# Patient Record
Sex: Male | Born: 1945 | Race: White | Hispanic: No | Marital: Married | Smoking: Never smoker
Health system: Southern US, Community
[De-identification: ages and names within clinical notes are randomized; demographics above are authoritative.]

## PROBLEM LIST (undated history)

## (undated) DIAGNOSIS — N419 Inflammatory disease of prostate, unspecified: Secondary | ICD-10-CM

## (undated) DIAGNOSIS — N210 Calculus in bladder: Secondary | ICD-10-CM

## (undated) DIAGNOSIS — K219 Gastro-esophageal reflux disease without esophagitis: Secondary | ICD-10-CM

## (undated) DIAGNOSIS — E291 Testicular hypofunction: Secondary | ICD-10-CM

## (undated) DIAGNOSIS — R972 Elevated prostate specific antigen [PSA]: Secondary | ICD-10-CM

## (undated) DIAGNOSIS — R3911 Hesitancy of micturition: Secondary | ICD-10-CM

## (undated) DIAGNOSIS — N200 Calculus of kidney: Secondary | ICD-10-CM

## (undated) DIAGNOSIS — Z87442 Personal history of urinary calculi: Secondary | ICD-10-CM

## (undated) DIAGNOSIS — N400 Enlarged prostate without lower urinary tract symptoms: Secondary | ICD-10-CM

## (undated) HISTORY — PX: EXTRACORPOREAL SHOCK WAVE LITHOTRIPSY: SHX1557

## (undated) HISTORY — DX: Testicular hypofunction: E29.1

## (undated) HISTORY — DX: Inflammatory disease of prostate, unspecified: N41.9

## (undated) HISTORY — DX: Calculus in bladder: N21.0

## (undated) HISTORY — DX: Hesitancy of micturition: R39.11

## (undated) HISTORY — DX: Benign prostatic hyperplasia without lower urinary tract symptoms: N40.0

## (undated) HISTORY — DX: Calculus of kidney: N20.0

## (undated) HISTORY — PX: WISDOM TOOTH EXTRACTION: SHX21

## (undated) HISTORY — PX: OTHER SURGICAL HISTORY: SHX169

---

## 2007-08-01 ENCOUNTER — Ambulatory Visit: Payer: Self-pay | Admitting: Internal Medicine

## 2007-09-03 ENCOUNTER — Ambulatory Visit: Payer: Self-pay | Admitting: Urology

## 2007-09-23 ENCOUNTER — Ambulatory Visit: Payer: Self-pay | Admitting: Urology

## 2007-11-09 ENCOUNTER — Ambulatory Visit: Payer: Self-pay | Admitting: Urology

## 2007-12-28 ENCOUNTER — Ambulatory Visit: Payer: Self-pay | Admitting: General Practice

## 2008-06-13 ENCOUNTER — Ambulatory Visit: Payer: Self-pay | Admitting: Urology

## 2008-06-16 ENCOUNTER — Ambulatory Visit: Payer: Self-pay | Admitting: Urology

## 2011-03-04 ENCOUNTER — Ambulatory Visit: Payer: Self-pay | Admitting: Urology

## 2011-03-14 ENCOUNTER — Ambulatory Visit: Payer: Self-pay | Admitting: Urology

## 2014-01-18 ENCOUNTER — Ambulatory Visit: Payer: Self-pay

## 2014-01-19 ENCOUNTER — Ambulatory Visit: Payer: Self-pay | Admitting: Urology

## 2014-01-21 ENCOUNTER — Ambulatory Visit: Payer: Self-pay | Admitting: Urology

## 2014-02-02 ENCOUNTER — Ambulatory Visit: Payer: Self-pay

## 2014-07-23 NOTE — Op Note (Signed)
PATIENT NAME:  Maxwell Jones, Maxwell Jones MR#:  [id number] DATE OF BIRTH:  February 03, 1946  DATE OF PROCEDURE:  01/21/2014  PREOPERATIVE DIAGNOSIS: Mid left ureteral calculus.   POSTOPERATIVE DIAGNOSIS: Mid left ureteral calculus.   PROCEDURE: Cystoscopy, flexible ureteroscopy, laser lithotripsy through the flexible scope, and stent.   DESCRIPTION OF PROCEDURE: With the patient sterilely prepped and draped in supine lithotomy position for ease of approach to the external genitalia, and after an appropriate timeout, I had cystoscoped the patient. I placed a sensor wire up the left ureter into the kidney. Repeated with a Glidewire. Attempted to go over the Glidewire with a flexible ureteroscope and I could not access the orifice. So then I used a Navigator to access the orifice. Placed the scope up through the Navigator and see a calculus in the mid ureter that is quite large. This appears soft, breaks up quite easily with a 365 micron holmium laser. Fragments quickly go up into the kidney as I break it up because this stone has been there for a long time. I chase the largest fragment up into a calyx and destroy it. The patient tolerated the procedure well. At this point because the pieces are so small, I am not going to be able to grasp them in the kidney so I put a 26 cm 6-French double-J stent up over the sensor wire. The Glidewire has been removed. The Glidewire was utilized to place the Navigator up into the ureter. The patient is sent to recovery. The bladder is emptied. The position of the stent is checked visually. It is in good position in the bladder and curled in the kidney. The patient is sent to recovery in satisfactory condition after placing a B and O suppository in the rectum and 30 mL of Marcaine in the bladder. Bladder is emptied. The patient should have no need for a Foley, as he does not have a large prostate. He has minimal trabeculation in the bladder. He has some minor middle lobe hypertrophy only. So the  rectal exam revealed no nodules, masses or growths in the rectum, either. The patient should do well postoperatively with a stent.   ____________________________ Janice Coffin. Elnoria Howard, DO rdh:ST D: 01/21/2014 13:36:43 ET T: 01/21/2014 23:48:36 ET JOB#: [id number]  cc: Janice Coffin. Elnoria Howard, DO, <Dictator> RICHARD D HART DO ELECTRONICALLY SIGNED 02/14/2014 16:33

## 2014-10-28 ENCOUNTER — Other Ambulatory Visit: Payer: Self-pay

## 2014-10-28 DIAGNOSIS — N200 Calculus of kidney: Secondary | ICD-10-CM

## 2014-11-29 ENCOUNTER — Encounter: Payer: Self-pay | Admitting: Urology

## 2014-12-07 ENCOUNTER — Ambulatory Visit: Payer: Self-pay | Admitting: Urology

## 2014-12-15 ENCOUNTER — Encounter: Payer: Self-pay | Admitting: Urology

## 2014-12-15 ENCOUNTER — Ambulatory Visit
Admission: RE | Admit: 2014-12-15 | Discharge: 2014-12-15 | Disposition: A | Discharge status: Home / Self Care | Payer: PPO | Source: Ambulatory Visit | Attending: Urology | Admitting: Urology

## 2014-12-15 ENCOUNTER — Ambulatory Visit (INDEPENDENT_AMBULATORY_CARE_PROVIDER_SITE_OTHER): Payer: PPO | Admitting: Urology

## 2014-12-15 VITALS — BP 142/81 | HR 85 | Ht 70.0 in | Wt 189.1 lb

## 2014-12-15 DIAGNOSIS — N200 Calculus of kidney: Secondary | ICD-10-CM | POA: Insufficient documentation

## 2014-12-15 DIAGNOSIS — Z87442 Personal history of urinary calculi: Secondary | ICD-10-CM | POA: Diagnosis not present

## 2014-12-15 DIAGNOSIS — N400 Enlarged prostate without lower urinary tract symptoms: Secondary | ICD-10-CM

## 2014-12-15 NOTE — Progress Notes (Signed)
12/15/2014 3:39 PM   [name] [birth year] [id number]  Referring provider: No referring provider defined for this encounter.  Chief Complaint  Patient presents with  . Nephrolithiasis    HPI: No voiding symptoms. History of recently passed calculus. One small calculus in the left kidney by KUB. Patient has no symptoms urgency frequency dysuria Cetera.   PMH: Past Medical History  Diagnosis Date  . Bladder calculi   . Kidney stone on left side   . Prostatitis   . BPH (benign prostatic hyperplasia)   . Hypogonadism in male   . Urinary hesitancy     Surgical History: Past Surgical History  Procedure Laterality Date  . Cystolithalopaxy w/holmium laser    . Extracorporeal shock wave lithotripsy      Home Medications:    Medication List       This list is accurate as of: 12/15/14  3:39 PM.  Always use your most recent med list.               ibuprofen 200 MG tablet  Commonly known as:  ADVIL,MOTRIN  Take by mouth.        Allergies:  Allergies  Allergen Reactions  . Iodinated Diagnostic Agents Rash  . Minocycline Rash    Family History: Family History  Problem Relation Age of Onset  . Prostate cancer Neg Hx   . Nephrolithiasis Mother   . Kidney cancer Neg Hx     Social History:  reports that he has quit smoking. He does not have any smokeless tobacco history on file. He reports that he drinks alcohol. He reports that he does not use illicit drugs.  ROS: UROLOGY Frequent Urination?: No Hard to postpone urination?: No Burning/pain with urination?: No Get up at night to urinate?: No Leakage of urine?: No Urine stream starts and stops?: No Trouble starting stream?: No Do you have to strain to urinate?: No Blood in urine?: No Urinary tract infection?: No Sexually transmitted disease?: No Injury to kidneys or bladder?: No Painful intercourse?: No Weak stream?: No Erection problems?: No Penile pain?: No  Gastrointestinal Nausea?:  No Vomiting?: No Indigestion/heartburn?: No Diarrhea?: No Constipation?: No  Constitutional Fever: No Night sweats?: No Weight loss?: No Fatigue?: No  Skin Skin rash/lesions?: No Itching?: No  Eyes Blurred vision?: No Double vision?: No  Ears/Nose/Throat Sore throat?: No Sinus problems?: No  Hematologic/Lymphatic Swollen glands?: No Easy bruising?: No  Cardiovascular Leg swelling?: No Chest pain?: No  Respiratory Cough?: No Shortness of breath?: No  Endocrine Excessive thirst?: No  Musculoskeletal Back pain?: No Joint pain?: No  Neurological Headaches?: No Dizziness?: No  Psychologic Depression?: No Anxiety?: No  Physical Exam: BP 142/81 mmHg  Pulse 85  Ht 5\' 10"  (1.778 m)  Wt 189 lb 1.6 oz (85.775 kg)  BMI 27.13 kg/m2  Constitutional:  Alert and oriented, No acute distress. HEENT: [place] AT, moist mucus membranes.  Trachea midline, no masses. Cardiovascular: No clubbing, cyanosis, or edema. Respiratory: Normal respiratory effort, no increased work of breathing. GI: Abdomen is soft, nontender, nondistended, no abdominal masses GU: No CVA tenderness. Small prostate, smooth firm nonnodular rectal sphincter normal no rectal masses penis uncircumcised no tumors masses or growths beneath foreskin testes descended and normal. Skin: No rashes, bruises or suspicious lesions. Lymph: No cervical or inguinal adenopathy. Neurologic: Grossly intact, no focal deficits, moving all 4 extremities. Psychiatric: Normal mood and affect.  Laboratory Data: No results found for: WBC, HGB, HCT, MCV, PLT  No results found for:  CREATININE  No results found for: PSA  No results found for: TESTOSTERONE  No results found for: HGBA1C  Urinalysis No results found for: COLORURINE, APPEARANCEUR, LABSPEC, PHURINE, GLUCOSEU, HGBUR, BILIRUBINUR, KETONESUR, PROTEINUR, UROBILINOGEN, NITRITE, LEUKOCYTESUR  Pertinent Imaging: KUB reveals small upper pole stone left kidney  Hx  recent passed calulousfollow-up yearly for KUB and PSA with routine BPH recheck  Assessment & Plan: history of calculi and BPH   1. BPH (benign prostatic hyperplasia) PSA drawn   No Follow-up on file.  Collier Flowers, [facility] [address] [place], [place] [address] [phone]

## 2014-12-16 LAB — PSA: Prostate Specific Ag, Serum: 5.4 ng/mL — ABNORMAL HIGH (ref 0.0–4.0)

## 2014-12-20 ENCOUNTER — Telehealth: Payer: Self-pay

## 2014-12-20 DIAGNOSIS — R972 Elevated prostate specific antigen [PSA]: Secondary | ICD-10-CM

## 2014-12-20 NOTE — Telephone Encounter (Signed)
-----   Message from Nori Riis, PA-C sent at 12/20/2014  8:22 AM EDT ----- Patient's PSA is elevated.  He should have it repeated to rule out lab error.

## 2014-12-20 NOTE — Telephone Encounter (Signed)
Spoke with pt in reference to PSA results. Pt will RTC tomorrow for psa lab draw.

## 2014-12-21 ENCOUNTER — Other Ambulatory Visit: Payer: PPO

## 2014-12-21 DIAGNOSIS — R972 Elevated prostate specific antigen [PSA]: Secondary | ICD-10-CM

## 2014-12-22 ENCOUNTER — Telehealth: Payer: Self-pay

## 2014-12-22 LAB — PSA: PROSTATE SPECIFIC AG, SERUM: 4.5 ng/mL — AB (ref 0.0–4.0)

## 2014-12-22 NOTE — Telephone Encounter (Signed)
-----   Message from Nori Riis, PA-C sent at 12/22/2014  8:57 AM EDT ----- Patient's PSA did not return to his baseline.  His last PSA with [place] on 08/12/2013 was 0.8 ng/mL.  It is now 4.5 ng/mL.  I would recommend a biopsy.  This is a significant increase.

## 2014-12-22 NOTE — Telephone Encounter (Signed)
Pt returned call. Made pt aware of PSA results. Pt requested prostate bx instructions be sent to him. Instructions sent via mail. Pt stated he would read up, think about it, and call back with any questions. Nurse reassured pt that would be ok but reinforced the need for the call back. Pt voiced understanding.

## 2015-01-05 ENCOUNTER — Ambulatory Visit (INDEPENDENT_AMBULATORY_CARE_PROVIDER_SITE_OTHER): Payer: PPO | Admitting: Urology

## 2015-01-06 ENCOUNTER — Ambulatory Visit: Payer: PPO | Admitting: Urology

## 2015-01-19 ENCOUNTER — Other Ambulatory Visit: Payer: Self-pay | Admitting: Urology

## 2015-01-19 ENCOUNTER — Encounter: Payer: Self-pay | Admitting: Urology

## 2015-01-19 ENCOUNTER — Ambulatory Visit: Payer: PPO | Admitting: Urology

## 2015-01-19 VITALS — BP 153/88 | HR 78 | Ht 70.0 in | Wt 190.3 lb

## 2015-01-19 DIAGNOSIS — R972 Elevated prostate specific antigen [PSA]: Secondary | ICD-10-CM

## 2015-01-19 MED ORDER — GENTAMICIN SULFATE 40 MG/ML IJ SOLN
80.0000 mg | Freq: Once | INTRAMUSCULAR | Status: AC
  Administered 2015-01-19: 80 mg via INTRAMUSCULAR

## 2015-01-19 MED ORDER — LEVOFLOXACIN 500 MG PO TABS
500.0000 mg | ORAL_TABLET | Freq: Once | ORAL | Status: AC
Start: 2015-01-19 — End: 2015-01-19
  Administered 2015-01-19: 500 mg via ORAL

## 2015-01-19 MED ORDER — LEVOFLOXACIN 500 MG PO TABS: 500.0000 mg | ORAL_TABLET | Freq: Every day | ORAL | Status: DC

## 2015-01-19 MED ORDER — LIDOCAINE HCL 2 % EX GEL
1.0000 "application " | Freq: Once | CUTANEOUS | Status: AC
  Administered 2015-01-19: 1 via URETHRAL

## 2015-01-19 NOTE — Progress Notes (Unsigned)
Prostate Biopsy Procedure   Informed consent was obtained after discussing risks/benefits of the procedure.  A time out was performed to ensure correct patient identity.  Pre-Procedure: - Last PSA Level:  5.4  - Gentamicin given prophylactically - Levaquin 500 mg administered PO -Transrectal Ultrasound performed revealing a  30 gm prostate -No significant hypoechoic or median lobe noted  Procedure: - Prostate block performed using 10 cc 1% lidocaine and biopsies taken from sextant areas, a total of 12 under ultrasound guidance.  Post-Procedure: - Patient tolerated the procedure well - He was counseled to seek immediate medical attention if experiences any severe pain, significant bleeding, or fevers - Return in one week to discuss biopsy results

## 2015-01-23 ENCOUNTER — Ambulatory Visit: Payer: PPO | Attending: Orthopedic Surgery | Admitting: Physical Therapy

## 2015-01-23 DIAGNOSIS — R293 Abnormal posture: Secondary | ICD-10-CM

## 2015-01-23 DIAGNOSIS — M5382 Other specified dorsopathies, cervical region: Secondary | ICD-10-CM | POA: Diagnosis present

## 2015-01-23 NOTE — Therapy (Signed)
[facility] [address] [place], [address] Phone: [phone]   Fax:  [phone]  Physical Therapy Evaluation  Patient Details  Name: KASHEEM TONER MRN: [id number] Date of Birth: [birth year] No Data Recorded  Encounter Date: 01/23/2015      PT End of Session - 01/23/15 1215    Visit Number 1   Number of Visits 1   Date for PT Re-Evaluation 02/20/15   PT Start Time 1100   PT Stop Time 1130   PT Time Calculation (min) 30 min   Activity Tolerance Patient tolerated treatment well;No increased pain   Behavior During Therapy [facility] for tasks assessed/performed      Past Medical History  Diagnosis Date  . Bladder calculi   . Kidney stone on left side   . Prostatitis   . BPH (benign prostatic hyperplasia)   . Hypogonadism in male   . Urinary hesitancy     Past Surgical History  Procedure Laterality Date  . Cystolithalopaxy w/holmium laser    . Extracorporeal shock wave lithotripsy      There were no vitals filed for this visit.  Visit Diagnosis:  Posture abnormality - Plan: PT plan of care cert/re-cert  Weakness of neck - Plan: PT plan of care cert/re-cert      Subjective Assessment - 01/23/15 1205    Subjective Patient reports that in June he had an episode of right arm heaviness, weakness and pain. He reports a second episode of this in the last few weeks. Reports that currently he is not having any symptoms or pain.    Patient Stated Goals "To not have to come back and not have this pain happen again."    Currently in Pain? 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  No   Is the patient reluctant to leave their home because of a fear of falling?  No   Home Ecologist residence   Prior Function   Level of Independence Independent   Cognition   Overall Cognitive Status Within Functional Limits for tasks assessed   Posture/Postural Control   Posture Comments rounded shoulders   ROM / Strength   AROM / PROM / Strength AROM;Strength   AROM   Overall AROM  Within functional limits for tasks performed   Overall AROM Comments cervical rotation is 45 degrees right, 50 degrees to left.cervical extension 70 degrees and 50 degrees of cervical flexion.    Strength   Overall Strength Within functional limits for tasks performed       Treatment this session to include:  Education and demonstration for cervical exercise and postural exercise as well as cervical stretching 1. Chin tuck 2. Prone cobra 3. Back postural exercise against wall.  4. Levator scap stretch Handout with instruction for proper technique, reps and frequency given to patient.        PT Education - 01/23/15 1214    Education provided Yes   Education Details neck and postural exercises to  include: chin tuck, prone cobra, and back postural exercise.   Person(s) Educated Patient   Methods Explanation;Demonstration;Handout;Verbal cues   Comprehension Verbalized understanding;Returned demonstration;Verbal cues required             PT Long Term Goals - 02-09-15 1219    PT LONG TERM GOAL #1   Title Patient will continue to report 0/10 pain in right arm.    Baseline 0/10   Time 4   Period Weeks   Status New   PT LONG TERM GOAL #2   Title Patient will be independent with HEP for improved strength, stretching and posture.    Time 4   Period Weeks   Status New               Plan - 02-09-15 1216    Clinical Impression Statement Patient is a 69 year old male with reported episodes of RIght arm heaviness, weakness, tingling and  pain. At time of evaluation patient does not have any pain or symptoms. Range of motion of neck and shoulders is WNL. Strenth is normal. Patient is shown and return demonstrates 3 exercises to practice on his own. Patient will call if any return of his symptoms occurs.    Pt will benefit from skilled therapeutic intervention in order to improve on the following deficits Postural dysfunction   Rehab Potential Good   PT Frequency Biweekly   PT Duration 4 weeks   PT Treatment/Interventions Therapeutic exercise;Patient/family education;Manual techniques   PT Home Exercise Plan chin tucks, prone cobra, pstural back/neck exercise. handout given.    Consulted and Agree with Plan of Care Patient          G-Codes - 02-09-2015 1222    Functional Limitation Carrying, moving and handling objects   Carrying, Moving and Handling Objects Current Status [id number] 0 percent impaired, limited or restricted   Carrying, Moving and Handling Objects Goal Status ([id number]) 0 percent impaired, limited or restricted   Carrying, Moving and Handling Objects Discharge Status [phone]) 0 percent impaired, limited or restricted       Problem List There are no active problems to display for this patient.   Colbin Jovel, PT, MPT, GCS February 09, 2015, 12:26 PM  [facility] [address] [place], [address] Phone: [phone]   Fax:  [phone]  Name: MELBURN TREIBER MRN: [id number] Date of Birth: 03/18/1946

## 2015-01-26 ENCOUNTER — Encounter: Payer: PPO | Admitting: Physical Therapy

## 2015-01-26 LAB — PATHOLOGY REPORT

## 2015-01-27 ENCOUNTER — Ambulatory Visit: Payer: PPO

## 2015-01-30 ENCOUNTER — Encounter: Payer: PPO | Admitting: Physical Therapy

## 2015-01-31 ENCOUNTER — Other Ambulatory Visit: Payer: Self-pay | Admitting: Urology

## 2015-01-31 ENCOUNTER — Ambulatory Visit: Payer: PPO | Admitting: Urology

## 2015-02-02 ENCOUNTER — Encounter: Payer: PPO | Admitting: Physical Therapy

## 2015-02-06 ENCOUNTER — Encounter: Payer: PPO | Admitting: Physical Therapy

## 2015-07-05 ENCOUNTER — Encounter: Payer: Self-pay | Admitting: Urology

## 2015-07-05 ENCOUNTER — Ambulatory Visit (INDEPENDENT_AMBULATORY_CARE_PROVIDER_SITE_OTHER): Payer: PPO | Admitting: Urology

## 2015-07-05 VITALS — BP 147/75 | HR 76 | Ht 70.0 in | Wt 196.3 lb

## 2015-07-05 DIAGNOSIS — N401 Enlarged prostate with lower urinary tract symptoms: Secondary | ICD-10-CM

## 2015-07-05 DIAGNOSIS — E291 Testicular hypofunction: Secondary | ICD-10-CM

## 2015-07-05 DIAGNOSIS — N138 Other obstructive and reflux uropathy: Secondary | ICD-10-CM | POA: Insufficient documentation

## 2015-07-05 DIAGNOSIS — N400 Enlarged prostate without lower urinary tract symptoms: Secondary | ICD-10-CM | POA: Diagnosis not present

## 2015-07-05 DIAGNOSIS — R972 Elevated prostate specific antigen [PSA]: Secondary | ICD-10-CM | POA: Diagnosis not present

## 2015-07-05 DIAGNOSIS — N200 Calculus of kidney: Secondary | ICD-10-CM

## 2015-07-05 LAB — URINALYSIS, COMPLETE
Bilirubin, UA: NEGATIVE
GLUCOSE, UA: NEGATIVE
KETONES UA: NEGATIVE
Leukocytes, UA: NEGATIVE
NITRITE UA: NEGATIVE
Protein, UA: NEGATIVE
SPEC GRAV UA: 1.025 (ref 1.005–1.030)
UUROB: 0.2 mg/dL (ref 0.2–1.0)
pH, UA: 6 (ref 5.0–7.5)

## 2015-07-05 LAB — MICROSCOPIC EXAMINATION
Bacteria, UA: NONE SEEN
Epithelial Cells (non renal): NONE SEEN /hpf (ref 0–10)

## 2015-07-05 NOTE — Progress Notes (Signed)
07/05/2015 9:04 AM   Maxwell Jones [birth year] [id number]  Referring provider: Madelyn Brunner, MD [facility] [place], [place] [address]  Chief Complaint  Patient presents with  . Benign Prostatic Hypertrophy    6 month follow up    HPI: Patient is a 70 year old Caucasian male who presents today for a 6 month follow up for nephrolithiasis, elevated PSA and BPH with LUTS.  Nephrolithiasis Patient has a stable 3 mm left renal stone.  Not experiencing any gross hematuria or flank pain.  He does not desire intervention at this time for the stone.  Elevated PSA Patient underwent a prostate biopsy on 01/19/2015 for an elevated PSA of 5.4.  This was a significant increase from his previous PSA of 0.8 in 2015.   Biopsy results were benign.  He does not have a family history of prostate cancer.  BPH WITH LUTS His IPSS score today is 1, which is mild lower urinary tract symptomatology. He is delighted with his quality life due to his urinary symptoms.  He denies any dysuria, hematuria or suprapubic pain.  He also denies any recent fevers, chills, nausea or vomiting.      IPSS      07/05/15 0800       International Prostate Symptom Score   How often have you had the sensation of not emptying your bladder? Not at All     How often have you had to urinate less than every two hours? Not at All     How often have you found you stopped and started again several times when you urinated? Not at All     How often have you found it difficult to postpone urination? Not at All     How often have you had a weak urinary stream? Not at All     How often have you had to strain to start urination? Not at All     How many times did you typically get up at night to urinate? 1 Time     Total IPSS Score 1     Quality of Life due to urinary symptoms   If you were to spend the rest of your life with your urinary condition just the way it is now how would you feel about that?  Delighted        Score:  1-7 Mild 8-19 Moderate 20-35 Severe     PMH: Past Medical History  Diagnosis Date  . Bladder calculi   . Kidney stone on left side   . Prostatitis   . BPH (benign prostatic hyperplasia)   . Hypogonadism in male   . Urinary hesitancy     Surgical History: Past Surgical History  Procedure Laterality Date  . Cystolithalopaxy w/holmium laser    . Extracorporeal shock wave lithotripsy      Home Medications:    Medication List       This list is accurate as of: 07/05/15  9:04 AM.  Always use your most recent med list.               ibuprofen 200 MG tablet  Commonly known as:  ADVIL,MOTRIN  Take by mouth. Reported on 07/05/2015        Allergies:  Allergies  Allergen Reactions  . Iodinated Diagnostic Agents Rash  . Minocycline Rash    Family History: Family History  Problem Relation Age of Onset  . Prostate cancer Neg Hx   .  Nephrolithiasis Mother   . Kidney cancer Neg Hx     Social History:  reports that he has never smoked. He does not have any smokeless tobacco history on file. He reports that he drinks alcohol. He reports that he does not use illicit drugs.  ROS: UROLOGY Frequent Urination?: No Hard to postpone urination?: No Burning/pain with urination?: No Get up at night to urinate?: No Leakage of urine?: No Urine stream starts and stops?: No Trouble starting stream?: No Do you have to strain to urinate?: No Blood in urine?: No Urinary tract infection?: No Sexually transmitted disease?: No Injury to kidneys or bladder?: No Painful intercourse?: No Weak stream?: No Erection problems?: No Penile pain?: No  Gastrointestinal Nausea?: No Vomiting?: No Indigestion/heartburn?: No Diarrhea?: No Constipation?: No  Constitutional Fever: No Night sweats?: No Weight loss?: No Fatigue?: No  Skin Skin rash/lesions?: No Itching?: No  Eyes Blurred vision?: No Double vision?: No  Ears/Nose/Throat Sore  throat?: No Sinus problems?: No  Hematologic/Lymphatic Swollen glands?: No Easy bruising?: No  Cardiovascular Leg swelling?: No Chest pain?: No  Respiratory Cough?: No Shortness of breath?: No  Endocrine Excessive thirst?: No  Musculoskeletal Back pain?: No Joint pain?: No  Neurological Headaches?: No Dizziness?: No  Psychologic Depression?: No Anxiety?: No  Physical Exam: BP 147/75 mmHg  Pulse 76  Ht 5\' 10"  (1.778 m)  Wt 196 lb 4.8 oz (89.041 kg)  BMI 28.17 kg/m2  Constitutional: Well nourished. Alert and oriented, No acute distress. HEENT: [place] AT, moist mucus membranes. Trachea midline, no masses. Cardiovascular: No clubbing, cyanosis, or edema. Respiratory: Normal respiratory effort, no increased work of breathing. GI: Abdomen is soft, non tender, non distended, no abdominal masses. Liver and spleen not palpable.  No hernias appreciated.  Stool sample for occult testing is not indicated.   GU: No CVA tenderness.  No bladder fullness or masses.  Patient with uncircumcised phallus. Foreskin easily retracted  Urethral meatus is patent.  No penile discharge. No penile lesions or rashes. Scrotum without lesions, cysts, rashes and/or edema.  Testicles are located scrotally bilaterally. No masses are appreciated in the testicles. Left and right epididymis are normal. Rectal: Patient with  normal sphincter tone. Anus and perineum without scarring or rashes. No rectal masses are appreciated. Prostate is approximately 55 grams, no nodules are appreciated. Seminal vesicles are normal. Skin: No rashes, bruises or suspicious lesions. Lymph: No cervical or inguinal adenopathy. Neurologic: Grossly intact, no focal deficits, moving all 4 extremities. Psychiatric: Normal mood and affect.  Laboratory Data: PSA History  5.4 ng/mL on 12/15/2014  4.5 ng/mL on 12/21/2014  TRUSPBx of prostate on 01/26/2015 was negative    Urinalysis Results for orders placed or performed in visit  on 07/05/15  Microscopic Examination  Result Value Ref Range   WBC, UA 0-5 0 -  5 /hpf   RBC, UA 0-2 0 -  2 /hpf   Epithelial Cells (non renal) None seen 0 - 10 /hpf   Bacteria, UA None seen None seen/Few  Urinalysis, Complete  Result Value Ref Range   Specific Gravity, UA 1.025 1.005 - 1.030   pH, UA 6.0 5.0 - 7.5   Color, UA Yellow Yellow   Appearance Ur Clear Clear   Leukocytes, UA Negative Negative   Protein, UA Negative Negative/Trace   Glucose, UA Negative Negative   Ketones, UA Negative Negative   RBC, UA Trace (A) Negative   Bilirubin, UA Negative Negative   Urobilinogen, Ur 0.2 0.2 - 1.0 mg/dL  Nitrite, UA Negative Negative   Microscopic Examination See below:     Pertinent Imaging: CLINICAL DATA: History of kidney stones no pain and hematuria today, 8 month followup  EXAM: ABDOMEN - 1 VIEW  COMPARISON: None.  FINDINGS: 3 mm stone over the left kidney lower pole. Bilateral pelvic phleboliths.  IMPRESSION: 3 mm left renal stone   Electronically Signed  By: Skipper Cliche M.D.  On: 12/15/2014 16:33  Assessment & Plan:    1. Elevated PSA:    iPSA 5.4 in 12/2014.  Biopsy benign.  Continue with PSA and exam biannually.  MRI if PSA returns elevated.    2. BPH (benign prostatic hyperplasia) with LUTS:   IPSS score is 1/0.  We will continue to monitor.  He will have his IPSS score, exam and PSA in 12 months.  - Urinalysis, Complete - PSA  3. Hypogonadism:   Experiencing hot flashes.  Check testosterone level today. (blood drawn before AM).  If low, not interested in low- T therapy but will get a DEXA scan.    4. Kidney stone:    Stable 3 mm left renal stone.  No intervention requested at this time.  Continue with KUB annually.     Return in about 6 months (around 01/04/2016) for IPSS score and exam.  These notes generated with voice recognition software. I apologize for typographical errors.  Zara Council, [facility] [address], [place], [place] [address] [phone]

## 2015-07-06 ENCOUNTER — Telehealth: Payer: Self-pay

## 2015-07-06 DIAGNOSIS — E291 Testicular hypofunction: Secondary | ICD-10-CM

## 2015-07-06 LAB — TESTOSTERONE: TESTOSTERONE: 201 ng/dL — AB (ref 348–1197)

## 2015-07-06 LAB — PSA: PROSTATE SPECIFIC AG, SERUM: 3.5 ng/mL (ref 0.0–4.0)

## 2015-07-06 NOTE — Telephone Encounter (Signed)
Spoke with pt in reference to testosterone labs and DEXA scan. Pt voiced understanding. Scan was ordered.

## 2015-07-06 NOTE — Telephone Encounter (Signed)
-----   Message from Nori Riis, PA-C sent at 07/06/2015  8:30 AM EDT ----- Patient's testosterone is low.  I would like to get a DEXA scan to check his bone density.

## 2015-07-06 NOTE — Telephone Encounter (Signed)
LMOM

## 2015-07-12 ENCOUNTER — Ambulatory Visit: Payer: PPO | Admitting: Urology

## 2015-12-11 DIAGNOSIS — D2372 Other benign neoplasm of skin of left lower limb, including hip: Secondary | ICD-10-CM | POA: Diagnosis not present

## 2015-12-11 DIAGNOSIS — R202 Paresthesia of skin: Secondary | ICD-10-CM | POA: Diagnosis not present

## 2015-12-11 DIAGNOSIS — L728 Other follicular cysts of the skin and subcutaneous tissue: Secondary | ICD-10-CM | POA: Diagnosis not present

## 2015-12-11 DIAGNOSIS — D2371 Other benign neoplasm of skin of right lower limb, including hip: Secondary | ICD-10-CM | POA: Diagnosis not present

## 2015-12-15 ENCOUNTER — Ambulatory Visit: Payer: PPO

## 2016-01-02 ENCOUNTER — Other Ambulatory Visit: Payer: PPO

## 2016-01-02 DIAGNOSIS — R972 Elevated prostate specific antigen [PSA]: Secondary | ICD-10-CM

## 2016-01-03 LAB — PSA: PROSTATE SPECIFIC AG, SERUM: 2.7 ng/mL (ref 0.0–4.0)

## 2016-01-04 ENCOUNTER — Encounter: Payer: Self-pay | Admitting: Urology

## 2016-01-04 ENCOUNTER — Ambulatory Visit (INDEPENDENT_AMBULATORY_CARE_PROVIDER_SITE_OTHER): Payer: PPO | Admitting: Urology

## 2016-01-04 VITALS — BP 122/75 | HR 79 | Ht 70.0 in | Wt 189.5 lb

## 2016-01-04 DIAGNOSIS — N401 Enlarged prostate with lower urinary tract symptoms: Secondary | ICD-10-CM | POA: Diagnosis not present

## 2016-01-04 DIAGNOSIS — N200 Calculus of kidney: Secondary | ICD-10-CM | POA: Diagnosis not present

## 2016-01-04 DIAGNOSIS — N138 Other obstructive and reflux uropathy: Secondary | ICD-10-CM | POA: Diagnosis not present

## 2016-01-04 DIAGNOSIS — Z87898 Personal history of other specified conditions: Secondary | ICD-10-CM | POA: Diagnosis not present

## 2016-01-04 NOTE — Progress Notes (Signed)
01/04/2016 8:49 AM   Maxwell Jones [birth year] [id number]  Referring provider: Madelyn Brunner, MD [name] [place], [place] [address]  Chief Complaint  Patient presents with  . Benign Prostatic Hypertrophy    6 month follow up  . Hypogonadism    HPI: Patient is a 70 year old Caucasian male who presents today for a 6 month follow up for nephrolithiasis, history of elevated PSA and BPH with LU TS.  Nephrolithiasis Patient has a stable 3 mm left renal stone.  Not experiencing any gross hematuria or flank pain.  He does not desire intervention at this time for the stone.  Elevated PSA Patient underwent a prostate biopsy on 01/19/2015 for an elevated PSA of 5.4.  This was a significant increase from his previous PSA of 0.8 in 2015.   Biopsy results were benign.  He does not have a family history of prostate cancer.  His most recent PSA was 2.7 ng/mL on 01/02/2016.    BPH WITH LU TS His IPS'S score today is 1, which is mild lower urinary tract symptomatology. He is delighted with his quality life due to his urinary symptoms.  His previous IPS'S score is 1/0.  He does not have major complaints at this time.  He denies any dysuria, hematuria or suprapubic pain.  He also denies any recent fevers, chills, nausea or vomiting.      IPSS    Row Name 01/04/16 0800         International Prostate Symptom Score   How often have you had the sensation of not emptying your bladder? Not at All     How often have you had to urinate less than every two hours? Not at All     How often have you found you stopped and started again several times when you urinated? Not at All     How often have you found it difficult to postpone urination? Not at All     How often have you had a weak urinary stream? Not at All     How often have you had to strain to start urination? Not at All     How many times did you typically get up at night to urinate? 1 Time     Total IPSS  Score 1       Quality of Life due to urinary symptoms   If you were to spend the rest of your life with your urinary condition just the way it is now how would you feel about that? Delighted        Score:  1-7 Mild 8-19 Moderate 20-35 Severe   PNH: Past Medical History:  Diagnosis Date  . Bladder calculi   . BPH (benign prostatic hyperplasia)   . Hypogonadism in male   . Kidney stone on left side   . Prostatitis   . Urinary hesitancy     Surgical History: Past Surgical History:  Procedure Laterality Date  . Cystolithalopaxy w/Holmium laser    . EXTRACORPOREAL SHOCK WAVE LITHOTRIPSY    . WISDOM TOOTH EXTRACTION      Home Medications:    Medication List       Accurate as of 01/04/16  8:49 AM. Always use your most recent med list.          ibuprofen 200 MG tablet Commonly known as:  ADVIL,MOTRIN Take by mouth. Reported on 07/05/2015       Allergies:  Allergies  Allergen Reactions  . Iodinated Diagnostic Agents Rash  . Minocycline Rash    Family History: Family History  Problem Relation Age of Onset  . Nephrolithiasis Mother   . Kidney cancer Brother   . Liver cancer Brother   . Prostate cancer Neg Hx     Social History:  reports that he has never smoked. He has never used smokeless tobacco. He reports that he drinks alcohol. He reports that he does not use drugs.  ROS: UROLOGY Frequent Urination?: No Hard to postpone urination?: No Burning/pain with urination?: No Get up at night to urinate?: No Leakage of urine?: No Urine stream starts and stops?: No Trouble starting stream?: No Do you have to strain to urinate?: No Blood in urine?: No Urinary tract infection?: No Sexually transmitted disease?: No Injury to kidneys or bladder?: No Painful intercourse?: No Weak stream?: No Erection problems?: No Penile pain?: No  Gastrointestinal Nausea?: No Vomiting?: No Indigestion/heartburn?: No Diarrhea?: No Constipation?:  No  Constitutional Fever: No Night sweats?: No Weight loss?: No Fatigue?: No  Skin Skin rash/lesions?: No Itching?: No  Eyes Blurred vision?: No Double vision?: No  Ears/Nose/Throat Sore throat?: No Sinus problems?: No  Hematologic/Lymphatic Swollen glands?: No Easy bruising?: No  Cardiovascular Leg swelling?: No Chest pain?: No  Respiratory Cough?: No Shortness of breath?: No  Endocrine Excessive thirst?: No  Musculoskeletal Back pain?: No Joint pain?: No  Neurological Headaches?: No Dizziness?: No  Psychologic Depression?: No Anxiety?: No  Physical Exam: BP 122/75   Pulse 79   Ht 5\' 10"  (1.778 m)   Wt 189 lb 8 oz (86 kg)   BMI 27.19 kg/m   Constitutional: Well nourished. Alert and oriented, No acute distress. HE ENT: [place] AT, moist mucus membranes. Trachea midline, no masses. Cardiovascular: No clubbing, cyanosis, or edema. Respiratory: Normal respiratory effort, no increased work of breathing. GI: Abdomen is soft, non tender, non distended, no abdominal masses. Liver and spleen not palpable.  No hernias appreciated.  Stool sample for occult testing is not indicated.   GU: No CVA tenderness.  No bladder fullness or masses.  Patient with uncircumcised phallus. Foreskin easily retracted  Urethral meatus is patent.  No penile discharge. No penile lesions or rashes. Scrotum without lesions, cysts, rashes and/or edema.  Testicles are located scrotally bilaterally. No masses are appreciated in the testicles. Left and right epididymis are normal. Rectal: Patient with  normal sphincter tone. Anus and perineum without scarring or rashes. No rectal masses are appreciated. Prostate is approximately 65 grams, no nodules are appreciated. Seminal vesicles are normal. Skin: No rashes, bruises or suspicious lesions. Lymph: No cervical or inguinal adenopathy. Neurologic: Grossly intact, no focal deficits, moving all 4 extremities. Psychiatric: Normal mood and  affect.  Laboratory Data: PSA History  5.4 ng/mL on 12/15/2014  4.5 ng/mL on 12/21/2014  Trusopt of prostate on 01/26/2015 was negative  Results for orders placed or performed in visit on 01/02/16  PSA  Result Value Ref Range   Prostate Specific Ag, Serum 2.7 0.0 - 4.0 ng/mL     Assessment & Plan:    1. History of elevated PSA  - PSA 5.4 in 12/2014 - Biopsy benign  - Recent PSA was 2.7 ng/mL on 01/02/2016  - Continue with PSA and exam biannually     2. BPH with LUTS  - IPSS score is 1/0, it is stable  - Continue conservative management, avoiding bladder irritants and timed voiding's  - ROTC in 6 months for IPS'S, PSA and  exam   3. Kidney stone:    Stable 3 mm left renal stone.  No intervention requested at this time.     Return in about 6 months (around 07/04/2016) for IPSS, PSA and exam.  These notes generated with voice recognition software. I apologize for typographical errors.  Zara Council, [facility] [address], [place], [place] [address] [phone]

## 2016-01-15 DIAGNOSIS — L729 Follicular cyst of the skin and subcutaneous tissue, unspecified: Secondary | ICD-10-CM | POA: Diagnosis not present

## 2016-01-15 DIAGNOSIS — L728 Other follicular cysts of the skin and subcutaneous tissue: Secondary | ICD-10-CM | POA: Diagnosis not present

## 2016-01-15 DIAGNOSIS — L538 Other specified erythematous conditions: Secondary | ICD-10-CM | POA: Diagnosis not present

## 2016-01-15 DIAGNOSIS — R202 Paresthesia of skin: Secondary | ICD-10-CM | POA: Diagnosis not present

## 2016-01-15 DIAGNOSIS — L905 Scar conditions and fibrosis of skin: Secondary | ICD-10-CM | POA: Diagnosis not present

## 2016-06-27 ENCOUNTER — Other Ambulatory Visit: Payer: PPO

## 2016-06-27 ENCOUNTER — Other Ambulatory Visit: Payer: Self-pay

## 2016-06-27 DIAGNOSIS — Z87898 Personal history of other specified conditions: Secondary | ICD-10-CM

## 2016-06-28 LAB — PSA: PROSTATE SPECIFIC AG, SERUM: 2.2 ng/mL (ref 0.0–4.0)

## 2016-07-02 NOTE — Progress Notes (Signed)
07/03/2016 10:24 AM   Maxwell Jones [birth year] [id number]  Referring provider: Madelyn Brunner, MD [place] [facility] [place], [place] [address]  Chief Complaint  Patient presents with  . Benign Prostatic Hypertrophy    6 month follow up   . Elevated PSA    HPI: Patient is a 71 year old Caucasian male who presents today for a 6 month follow up for nephrolithiasis, history of elevated PSA and BPH with LU TS.  Nephrolithiasis Patient has a stable 3 mm left renal stone.  Not experiencing any gross hematuria or flank pain.  He does not desire intervention at this time for the stone.  Elevated PSA Patient underwent a prostate biopsy on 01/19/2015 for an elevated PSA of 5.4.  This was a significant increase from his previous PSA of 0.8 in 2015.   Biopsy results were benign.  He does not have a family history of prostate cancer.  His most recent PSA was 2.2 ng/mL on 06/27/2016.  [name] Kitchen    BPH WITH LU TS His IPS'S score today is 1, which is mild lower urinary tract symptomatology. He is delighted with his quality life due to his urinary symptoms.  His previous I PSS score is 1/0.  He does not have major complaints at this time.  He denies any dysuria, hematuria or suprapubic pain.  He also denies any recent fevers, chills, nausea or vomiting.      IPSS    Row Name 07/03/16 1000         International Prostate Symptom Score   How often have you had the sensation of not emptying your bladder? Not at All     How often have you had to urinate less than every two hours? Not at All     How often have you found you stopped and started again several times when you urinated? Not at All     How often have you found it difficult to postpone urination? Not at All     How often have you had a weak urinary stream? Not at All     How often have you had to strain to start urination? Not at All     How many times did you typically get up at night to urinate? 1 Time     Total IPSS  Score 1       Quality of Life due to urinary symptoms   If you were to spend the rest of your life with your urinary condition just the way it is now how would you feel about that? Delighted        Score:  1-7 Mild 8-19 Moderate 20-35 Severe   PNH: Past Medical History:  Diagnosis Date  . Bladder calculi   . BPH (benign prostatic hyperplasia)   . Hypogonadism in male   . Kidney stone on left side   . Prostatitis   . Urinary hesitancy     Surgical History: Past Surgical History:  Procedure Laterality Date  . Cystolithalopaxy w/Holmium laser    . EXTRACORPOREAL SHOCK WAVE LITHOTRIPSY    . WISDOM TOOTH EXTRACTION      Home Medications:  Allergies as of 07/03/2016      Reactions   Iodinated Diagnostic Agents Rash   Minocycline Rash      Medication List       Accurate as of 07/03/16 10:24 AM. Always use your most recent med list.  ibuprofen 200 MG tablet Commonly known as:  ADVIL,MOTRIN Take by mouth. Reported on 07/05/2015   OVER THE COUNTER MEDICATION Allergy medication       Allergies:  Allergies  Allergen Reactions  . Iodinated Diagnostic Agents Rash  . Minocycline Rash    Family History: Family History  Problem Relation Age of Onset  . Nephrolithiasis Mother   . Kidney cancer Brother   . Liver cancer Brother   . Prostate cancer Neg Hx   . Bladder Cancer Neg Hx     Social History:  reports that he has never smoked. He has never used smokeless tobacco. He reports that he drinks alcohol. He reports that he does not use drugs.  ROS: UROLOGY Frequent Urination?: No Hard to postpone urination?: No Burning/pain with urination?: No Get up at night to urinate?: Yes Leakage of urine?: No Urine stream starts and stops?: No Trouble starting stream?: No Do you have to strain to urinate?: No Blood in urine?: No Urinary tract infection?: No Sexually transmitted disease?: No Injury to kidneys or bladder?: No Painful intercourse?: No Weak  stream?: No Erection problems?: No Penile pain?: No  Gastrointestinal Nausea?: No Vomiting?: No Indigestion/heartburn?: No Diarrhea?: No Constipation?: No  Constitutional Fever: No Night sweats?: No Weight loss?: No Fatigue?: No  Skin Skin rash/lesions?: No Itching?: No  Eyes Blurred vision?: No Double vision?: No  Ears/Nose/Throat Sore throat?: No Sinus problems?: No  Hematologic/Lymphatic Swollen glands?: No Easy bruising?: No  Cardiovascular Leg swelling?: No Chest pain?: No  Respiratory Cough?: No Shortness of breath?: No  Endocrine Excessive thirst?: No  Musculoskeletal Back pain?: No Joint pain?: No  Neurological Headaches?: No Dizziness?: No  Psychologic Depression?: No Anxiety?: No  Physical Exam: BP 128/79   Pulse 72   Ht 5\' 10"  (1.778 m)   Wt 193 lb 11.2 oz (87.9 kg)   BMI 27.79 kg/m   Constitutional: Well nourished. Alert and oriented, No acute distress. HE ENT: [place] AT, moist mucus membranes. Trachea midline, no masses. Cardiovascular: No clubbing, cyanosis, or edema. Respiratory: Normal respiratory effort, no increased work of breathing. GI: Abdomen is soft, non tender, non distended, no abdominal masses. Liver and spleen not palpable.  No hernias appreciated.  Stool sample for occult testing is not indicated.   GU: No CVA tenderness.  No bladder fullness or masses.  Patient with uncircumcised phallus. Foreskin easily retracted  Urethral meatus is patent.  No penile discharge. No penile lesions or rashes. Scrotum without lesions, cysts, rashes and/or edema.  Testicles are located scrotally bilaterally. No masses are appreciated in the testicles. Left and right epididymis are normal. Rectal: Patient with  normal sphincter tone. Anus and perineum without scarring or rashes. No rectal masses are appreciated. Prostate is approximately 65 grams, no nodules are appreciated. Seminal vesicles are normal. Skin: No rashes, bruises or suspicious  lesions. Lymph: No cervical or inguinal adenopathy. Neurologic: Grossly intact, no focal deficits, moving all 4 extremities. Psychiatric: Normal mood and affect.  Laboratory Data: PSA History  5.4 ng/mL on 12/15/2014  4.5 ng/mL on 12/21/2014  biopsy of prostate on 01/26/2015 was negative  2.7 ng/mL on 01/02/2016  Results for orders placed or performed in visit on 06/27/16  PSA  Result Value Ref Range   Prostate Specific Ag, Serum 2.2 0.0 - 4.0 ng/mL     Assessment & Plan:    1. History of elevated PSA  - PSA 5.4 in 12/2014 - Biopsy benign  - Recent PSA was 2.2 ng/mL on 06/27/2016  -  Continue with PSA and exam biannually     2. BPH with LUTS  - IPSS score is 1/0, it is stable  - Continue conservative management, avoiding bladder irritants and timed voiding's  - RTC in 6 months for I PSS, PSA and exam   3. Kidney stone:    Stable 3 mm left renal stone.  No intervention requested at this time.     Return in about 6 months (around 01/02/2017) for IPSS, PSA and exam.  These notes generated with voice recognition software. I apologize for typographical errors.  Zara Council, [facility] [address] [place], [place] [address] [phone]

## 2016-07-03 ENCOUNTER — Ambulatory Visit: Payer: PPO | Admitting: Urology

## 2016-07-03 ENCOUNTER — Encounter: Payer: Self-pay | Admitting: Urology

## 2016-07-03 VITALS — BP 128/79 | HR 72 | Ht 70.0 in | Wt 193.7 lb

## 2016-07-03 DIAGNOSIS — Z87898 Personal history of other specified conditions: Secondary | ICD-10-CM

## 2016-07-03 DIAGNOSIS — N138 Other obstructive and reflux uropathy: Secondary | ICD-10-CM

## 2016-07-03 DIAGNOSIS — N200 Calculus of kidney: Secondary | ICD-10-CM | POA: Diagnosis not present

## 2016-07-03 DIAGNOSIS — N401 Enlarged prostate with lower urinary tract symptoms: Secondary | ICD-10-CM | POA: Diagnosis not present

## 2016-07-04 ENCOUNTER — Ambulatory Visit: Payer: PPO | Admitting: Urology

## 2016-11-28 DIAGNOSIS — H1032 Unspecified acute conjunctivitis, left eye: Secondary | ICD-10-CM | POA: Diagnosis not present

## 2016-12-03 DIAGNOSIS — H15122 Nodular episcleritis, left eye: Secondary | ICD-10-CM | POA: Diagnosis not present

## 2016-12-30 ENCOUNTER — Other Ambulatory Visit: Payer: Self-pay

## 2016-12-30 DIAGNOSIS — Z87898 Personal history of other specified conditions: Secondary | ICD-10-CM

## 2016-12-31 ENCOUNTER — Other Ambulatory Visit: Payer: PPO

## 2016-12-31 DIAGNOSIS — Z87898 Personal history of other specified conditions: Secondary | ICD-10-CM | POA: Diagnosis not present

## 2016-12-31 NOTE — Progress Notes (Signed)
01/02/2017 9:36 AM   Maxwell Jones Apr 11, 1945 [id number]  Referring provider: Madelyn Brunner, MD [place] [facility] [place], [place] [address]  Chief Complaint  Patient presents with  . Follow-up    6 month follow up psa/bph/Stone    HPI: Patient is a 71 year old Caucasian male who presents today for a 6 month follow up for nephrolithiasis, history of elevated PSA and BPH with LU TS.  Nephrolithiasis Patient has a stable 3 mm left renal stone.  Not experiencing any gross hematuria or flank pain.  He does not desire intervention at this time for the stone.  Elevated PSA Patient underwent a prostate biopsy on 01/19/2015 for an elevated PSA of 5.4.  This was a significant increase from his previous PSA of 0.8 in 2015.   Biopsy results were benign.  He does not have a family history of prostate cancer.  His most recent PSA was 2.6 ng/mL on 12/31/2016.      BPH WITH LU TS His I PSS score today is 4, which is mild lower urinary tract symptomatology. He is pleased with his quality life due to his urinary symptoms.  His previous I PSS score is 1/0.  He does not have major complaints at this time.  He denies any dysuria, hematuria or suprapubic pain.  He also denies any recent fevers, chills, nausea or vomiting.      IPSS    Row Name 01/02/17 0800         International Prostate Symptom Score   How often have you had the sensation of not emptying your bladder? Not at All     How often have you had to urinate less than every two hours? Less than 1 in 5 times     How often have you found you stopped and started again several times when you urinated? Not at All     How often have you found it difficult to postpone urination? Less than 1 in 5 times     How often have you had a weak urinary stream? Less than 1 in 5 times     How often have you had to strain to start urination? Not at All     How many times did you typically get up at night to urinate? 1 Time     Total IPSS Score 4       Quality of Life due to urinary symptoms   If you were to spend the rest of your life with your urinary condition just the way it is now how would you feel about that? Pleased        Score:  1-7 Mild 8-19 Moderate 20-35 Severe   PNH: Past Medical History:  Diagnosis Date  . Bladder calculi   . BPH (benign prostatic hyperplasia)   . Hypogonadism in male   . Kidney stone on left side   . Prostatitis   . Urinary hesitancy     Surgical History: Past Surgical History:  Procedure Laterality Date  . Cystolithalopaxy w/Holmium laser    . EXTRACORPOREAL SHOCK WAVE LITHOTRIPSY    . WISDOM TOOTH EXTRACTION      Home Medications:  Allergies as of 01/02/2017      Reactions   Iodinated Diagnostic Agents Rash   Minocycline Rash      Medication List       Accurate as of 01/02/17  9:36 AM. Always use your most recent med list.  ACETAMINOPHEN PO Take by mouth.   ibuprofen 200 MG tablet Commonly known as:  ADVIL,MOTRIN Take by mouth. Reported on 07/05/2015   OVER THE COUNTER MEDICATION Allergy medication       Allergies:  Allergies  Allergen Reactions  . Iodinated Diagnostic Agents Rash  . Minocycline Rash    Family History: Family History  Problem Relation Age of Onset  . Nephrolithiasis Mother   . Kidney cancer Brother   . Liver cancer Brother   . Squamous cell carcinoma Brother   . Prostate cancer Neg Hx   . Bladder Cancer Neg Hx     Social History:  reports that he has never smoked. He has never used smokeless tobacco. He reports that he drinks alcohol. He reports that he does not use drugs.  ROS: UROLOGY Frequent Urination?: No Hard to postpone urination?: No Burning/pain with urination?: No Get up at night to urinate?: Yes Leakage of urine?: No Urine stream starts and stops?: No Trouble starting stream?: No Do you have to strain to urinate?: No Blood in urine?: No Urinary tract infection?: No Sexually  transmitted disease?: No Injury to kidneys or bladder?: No Painful intercourse?: No Weak stream?: No Erection problems?: No Penile pain?: No  Gastrointestinal Nausea?: No Vomiting?: No Indigestion/heartburn?: No Diarrhea?: No Constipation?: No  Constitutional Fever: No Night sweats?: No Weight loss?: No Fatigue?: No  Skin Skin rash/lesions?: No Itching?: No  Eyes Blurred vision?: No Double vision?: No  Ears/Nose/Throat Sore throat?: No Sinus problems?: No  Hematologic/Lymphatic Swollen glands?: No Easy bruising?: No  Cardiovascular Leg swelling?: No Chest pain?: No  Respiratory Cough?: No Shortness of breath?: No  Endocrine Excessive thirst?: No  Musculoskeletal Back pain?: No Joint pain?: No  Neurological Headaches?: No Dizziness?: No  Psychologic Depression?: No Anxiety?: No  Physical Exam: BP 120/72   Pulse 81   Ht 5\' 10"  (1.778 m)   Wt 191 lb 8 oz (86.9 kg)   BMI 27.48 kg/m   Constitutional: Well nourished. Alert and oriented, No acute distress. HE ENT: [place] AT, moist mucus membranes. Trachea midline, no masses. Cardiovascular: No clubbing, cyanosis, or edema. Respiratory: Normal respiratory effort, no increased work of breathing. GI: Abdomen is soft, non tender, non distended, no abdominal masses. Liver and spleen not palpable.  No hernias appreciated.  Stool sample for occult testing is not indicated.   GU: No CVA tenderness.  No bladder fullness or masses.  Patient with uncircumcised phallus. Foreskin easily retracted  Urethral meatus is patent.  No penile discharge. No penile lesions or rashes. Scrotum without lesions, cysts, rashes and/or edema.  Testicles are located scrotally bilaterally. No masses are appreciated in the testicles. Left and right epididymis are normal. Rectal: Patient with  normal sphincter tone. Anus and perineum without scarring or rashes. No rectal masses are appreciated. Prostate is approximately 65 grams, no  nodules are appreciated. Seminal vesicles are normal. Skin: No rashes, bruises or suspicious lesions. Lymph: No cervical or inguinal adenopathy. Neurologic: Grossly intact, no focal deficits, moving all 4 extremities. Psychiatric: Normal mood and affect.  Laboratory Data: PSA History  5.4 ng/mL on 12/15/2014  4.5 ng/mL on 12/21/2014  biopsy of prostate on 01/26/2015 was negative  2.7 ng/mL on 01/02/2016  Results for orders placed or performed in visit on 12/31/16  PSA  Result Value Ref Range   Prostate Specific Ag, Serum 2.6 0.0 - 4.0 ng/mL   I have reviewed the labs.  Assessment & Plan:    1. History of elevated PSA  -  PSA 5.4 in 12/2014 - Biopsy benign  - Recent PSA was 2.6  - Continue with PSA and exam biannually     2. BPH with LUTS  - IPSS score is 4/0, it is Worsening  - Continue conservative management, avoiding bladder irritants and timed voiding's  - RTC in 6 months for I PSS, PSA and exam   3. Kidney stone:    Stable 3 mm left renal stone.  No intervention requested at this time.     Return in about 6 months (around 07/03/2017) for IPSS, PSA and exam.  These notes generated with voice recognition software. I apologize for typographical errors.  Zara Council, [facility] [address] [place], [place] [address] [phone]

## 2017-01-01 LAB — PSA: PROSTATE SPECIFIC AG, SERUM: 2.6 ng/mL (ref 0.0–4.0)

## 2017-01-02 ENCOUNTER — Encounter: Payer: Self-pay | Admitting: Urology

## 2017-01-02 ENCOUNTER — Ambulatory Visit (INDEPENDENT_AMBULATORY_CARE_PROVIDER_SITE_OTHER): Payer: PPO | Admitting: Urology

## 2017-01-02 VITALS — BP 120/72 | HR 81 | Ht 70.0 in | Wt 191.5 lb

## 2017-01-02 DIAGNOSIS — Z87898 Personal history of other specified conditions: Secondary | ICD-10-CM

## 2017-01-02 DIAGNOSIS — N401 Enlarged prostate with lower urinary tract symptoms: Secondary | ICD-10-CM | POA: Diagnosis not present

## 2017-01-02 DIAGNOSIS — N138 Other obstructive and reflux uropathy: Secondary | ICD-10-CM

## 2017-01-02 DIAGNOSIS — N200 Calculus of kidney: Secondary | ICD-10-CM | POA: Diagnosis not present

## 2017-01-13 DIAGNOSIS — Z0000 Encounter for general adult medical examination without abnormal findings: Secondary | ICD-10-CM | POA: Diagnosis not present

## 2017-01-13 DIAGNOSIS — Z23 Encounter for immunization: Secondary | ICD-10-CM | POA: Diagnosis not present

## 2017-01-13 DIAGNOSIS — K219 Gastro-esophageal reflux disease without esophagitis: Secondary | ICD-10-CM | POA: Diagnosis not present

## 2017-01-31 DIAGNOSIS — D225 Melanocytic nevi of trunk: Secondary | ICD-10-CM | POA: Diagnosis not present

## 2017-01-31 DIAGNOSIS — D2261 Melanocytic nevi of right upper limb, including shoulder: Secondary | ICD-10-CM | POA: Diagnosis not present

## 2017-01-31 DIAGNOSIS — L821 Other seborrheic keratosis: Secondary | ICD-10-CM | POA: Diagnosis not present

## 2017-01-31 DIAGNOSIS — X32XXXA Exposure to sunlight, initial encounter: Secondary | ICD-10-CM | POA: Diagnosis not present

## 2017-01-31 DIAGNOSIS — L570 Actinic keratosis: Secondary | ICD-10-CM | POA: Diagnosis not present

## 2017-01-31 DIAGNOSIS — D2272 Melanocytic nevi of left lower limb, including hip: Secondary | ICD-10-CM | POA: Diagnosis not present

## 2017-04-16 DIAGNOSIS — H2513 Age-related nuclear cataract, bilateral: Secondary | ICD-10-CM | POA: Diagnosis not present

## 2017-04-16 DIAGNOSIS — H524 Presbyopia: Secondary | ICD-10-CM | POA: Diagnosis not present

## 2017-04-16 DIAGNOSIS — H52223 Regular astigmatism, bilateral: Secondary | ICD-10-CM | POA: Diagnosis not present

## 2017-04-16 DIAGNOSIS — H5203 Hypermetropia, bilateral: Secondary | ICD-10-CM | POA: Diagnosis not present

## 2017-06-30 ENCOUNTER — Other Ambulatory Visit: Payer: PPO

## 2017-06-30 ENCOUNTER — Other Ambulatory Visit: Payer: Self-pay

## 2017-06-30 DIAGNOSIS — Z87898 Personal history of other specified conditions: Secondary | ICD-10-CM

## 2017-07-01 LAB — PSA: Prostate Specific Ag, Serum: 2.1 ng/mL (ref 0.0–4.0)

## 2017-07-01 NOTE — Progress Notes (Signed)
07/02/2017 8:37 AM   Maxwell Jones [birth year] [id number]  Referring provider: Madelyn Brunner, MD No address on file  Chief Complaint  Patient presents with  . Elevated PSA    HPI: Patient is a 72 year old Caucasian male who presents today for a 6 month follow up for nephrolithiasis, history of elevated PSA and BPH with LU TS.  Nephrolithiasis Patient has a stable 3 mm left renal stone.  Not experiencing any gross hematuria or flank pain.  He does not desire intervention at this time for the stone.  History of elevated PSA Patient underwent a prostate biopsy on 01/19/2015 for an elevated PSA of 5.4.  This was a significant increase from his previous PSA of 0.8 in 2015.   Biopsy results were benign.  He does not have a family history of prostate cancer.  His most recent PSA was 2.1 ng/mL on 06/30/2017.    BPH WITH LU TS His I PSS score today is 3, which is mild lower urinary tract symptomatology. He is pleased with his quality life due to his urinary symptoms.  His previous I PSS score is 4/1.  He does not have major complaints at this time.  He denies any dysuria, hematuria or suprapubic pain.  He also denies any recent fevers, chills, nausea or vomiting. IPSS    Row Name 07/02/17 0800         International Prostate Symptom Score   How often have you had the sensation of not emptying your bladder?  Not at All     How often have you had to urinate less than every two hours?  Not at All     How often have you found you stopped and started again several times when you urinated?  Not at All     How often have you found it difficult to postpone urination?  Less than 1 in 5 times     How often have you had a weak urinary stream?  Less than 1 in 5 times     How often have you had to strain to start urination?  Not at All     How many times did you typically get up at night to urinate?  1 Time     Total IPSS Score  3       Quality of Life due to urinary symptoms   If you were to  spend the rest of your life with your urinary condition just the way it is now how would you feel about that?  Pleased        Score:  1-7 Mild 8-19 Moderate 20-35 Severe   PNH: Past Medical History:  Diagnosis Date  . Bladder calculi   . BPH (benign prostatic hyperplasia)   . Hypogonadism in male   . Kidney stone on left side   . Prostatitis   . Urinary hesitancy     Surgical History: Past Surgical History:  Procedure Laterality Date  . Cystolithalopaxy w/Holmium laser    . EXTRACORPOREAL SHOCK WAVE LITHOTRIPSY    . WISDOM TOOTH EXTRACTION      Home Medications:  Allergies as of 07/02/2017      Reactions   Iodinated Diagnostic Agents Rash   Minocycline Rash      Medication List        Accurate as of 07/02/17  8:37 AM. Always use your most recent med list.          ACETAMINOPHEN PO Take  by mouth.   ibuprofen 200 MG tablet Commonly known as:  ADVIL,MOTRIN Take by mouth. Reported on 07/05/2015   OVER THE COUNTER MEDICATION Allergy medication       Allergies:  Allergies  Allergen Reactions  . Iodinated Diagnostic Agents Rash  . Minocycline Rash    Family History: Family History  Problem Relation Age of Onset  . Nephrolithiasis Mother   . Kidney cancer Brother   . Liver cancer Brother   . Squamous cell carcinoma Brother   . Prostate cancer Neg Hx   . Bladder Cancer Neg Hx     Social History:  reports that he has never smoked. He has never used smokeless tobacco. He reports that he drinks alcohol. He reports that he does not use drugs.  ROS: UROLOGY Frequent Urination?: No Hard to postpone urination?: No Burning/pain with urination?: No Get up at night to urinate?: No Leakage of urine?: No Urine stream starts and stops?: No Trouble starting stream?: No Do you have to strain to urinate?: No Blood in urine?: No Urinary tract infection?: No Sexually transmitted disease?: No Injury to kidneys or bladder?: No Painful intercourse?: No Weak  stream?: No Erection problems?: No Penile pain?: No  Gastrointestinal Nausea?: No Vomiting?: No Indigestion/heartburn?: No Diarrhea?: No Constipation?: No  Constitutional Fever: No Night sweats?: No Weight loss?: No Fatigue?: No  Skin Skin rash/lesions?: No Itching?: No  Eyes Blurred vision?: No Double vision?: No  Ears/Nose/Throat Sore throat?: No Sinus problems?: No  Hematologic/Lymphatic Swollen glands?: No Easy bruising?: No  Cardiovascular Leg swelling?: No Chest pain?: No  Respiratory Cough?: No Shortness of breath?: No  Endocrine Excessive thirst?: No  Musculoskeletal Back pain?: Yes Joint pain?: No  Neurological Headaches?: No Dizziness?: No  Psychologic Depression?: No Anxiety?: No  Physical Exam: BP (!) 143/75 (BP Location: Right Arm, Patient Position: Sitting, Cuff Size: Normal)   Pulse 76   Ht 5\' 10"  (1.778 m)   Wt 190 lb 12.8 oz (86.5 kg)   BMI 27.38 kg/m   Constitutional: Well nourished. Alert and oriented, No acute distress. HEENT: [place] AT, moist mucus membranes. Trachea midline, no masses. Cardiovascular: No clubbing, cyanosis, or edema. Respiratory: Normal respiratory effort, no increased work of breathing. GI: Abdomen is soft, non tender, non distended, no abdominal masses. Liver and spleen not palpable.  No hernias appreciated.  Stool sample for occult testing is not indicated.   GU: No CVA tenderness.  No bladder fullness or masses.  Patient with uncircumcised phallus.  Foreskin easily retracted.  Urethral meatus is patent.  No penile discharge. No penile lesions or rashes. Scrotum without lesions, cysts, rashes and/or edema.  Testicles are located scrotally bilaterally. No masses are appreciated in the testicles. Left and right epididymis are normal. Rectal: Patient with  normal sphincter tone. Anus and perineum without scarring or rashes. No rectal masses are appreciated. Prostate is approximately 65 grams, no nodules are  appreciated. Seminal vesicles are normal. Skin: No rashes, bruises or suspicious lesions. Lymph: No cervical or inguinal adenopathy. Neurologic: Grossly intact, no focal deficits, moving all 4 extremities. Psychiatric: Normal mood and affect.   Laboratory Data: PSA History  5.4 ng/mL on 12/15/2014  4.5 ng/mL on 12/21/2014  biopsy of prostate on 01/26/2015 was negative  2.7 ng/mL on 01/02/2016  Results for orders placed or performed in visit on 06/30/17  PSA  Result Value Ref Range   Prostate Specific Ag, Serum 2.1 0.0 - 4.0 ng/mL   I have reviewed the labs.  Assessment & Plan:  1. History of elevated PSA  - PSA 5.4 in 12/2014 - Biopsy benign  - Recent PSA was 2.1  - Continue with PSA and exam biannually     2. BPH with LUTS  - IPSS score is 3/1, it is stable  - Continue conservative management, avoiding bladder irritants and timed voiding's  - RTC in 6 months for I PSS, PSA and exam   3. Kidney stone:    Stable 3 mm left renal stone.  No intervention requested at this time.     Return in about 6 months (around 01/01/2018) for IPSS, PSA and exam.  These notes generated with voice recognition software. I apologize for typographical errors.  Zara Council, [facility] [address] [place], [place] [address] [phone]

## 2017-07-02 ENCOUNTER — Encounter: Payer: Self-pay | Admitting: Urology

## 2017-07-02 ENCOUNTER — Ambulatory Visit: Payer: PPO | Admitting: Urology

## 2017-07-02 VITALS — BP 143/75 | HR 76 | Ht 70.0 in | Wt 190.8 lb

## 2017-07-02 DIAGNOSIS — N138 Other obstructive and reflux uropathy: Secondary | ICD-10-CM

## 2017-07-02 DIAGNOSIS — Z87898 Personal history of other specified conditions: Secondary | ICD-10-CM | POA: Diagnosis not present

## 2017-07-02 DIAGNOSIS — N401 Enlarged prostate with lower urinary tract symptoms: Secondary | ICD-10-CM | POA: Diagnosis not present

## 2017-07-02 DIAGNOSIS — N200 Calculus of kidney: Secondary | ICD-10-CM | POA: Diagnosis not present

## 2017-12-25 ENCOUNTER — Other Ambulatory Visit: Payer: PPO

## 2017-12-25 ENCOUNTER — Other Ambulatory Visit: Payer: Self-pay

## 2017-12-25 DIAGNOSIS — Z87898 Personal history of other specified conditions: Secondary | ICD-10-CM

## 2017-12-26 LAB — PSA: PROSTATE SPECIFIC AG, SERUM: 2.7 ng/mL (ref 0.0–4.0)

## 2018-01-01 ENCOUNTER — Ambulatory Visit: Payer: PPO | Admitting: Urology

## 2018-01-06 ENCOUNTER — Ambulatory Visit: Payer: PPO | Admitting: Urology

## 2018-01-06 ENCOUNTER — Encounter: Payer: Self-pay | Admitting: Urology

## 2018-01-06 VITALS — BP 148/90 | HR 73 | Ht 70.0 in | Wt 188.7 lb

## 2018-01-06 DIAGNOSIS — N401 Enlarged prostate with lower urinary tract symptoms: Secondary | ICD-10-CM | POA: Diagnosis not present

## 2018-01-06 DIAGNOSIS — Z87898 Personal history of other specified conditions: Secondary | ICD-10-CM

## 2018-01-06 DIAGNOSIS — N200 Calculus of kidney: Secondary | ICD-10-CM

## 2018-01-06 DIAGNOSIS — N138 Other obstructive and reflux uropathy: Secondary | ICD-10-CM | POA: Diagnosis not present

## 2018-01-06 NOTE — Progress Notes (Addendum)
01/06/2018 11:09 AM   Maxwell Jones [birth year] [id number]  Referring provider: Baxter Hire, MD [place], [place] [address]  Chief Complaint  Patient presents with  . Elevated PSA    follow up    HPI: Patient is a 72 year old Caucasian male who presents today for a 6 month follow up for nephrolithiasis, history of elevated PSA and BPH with LU TS.  Nephrolithiasis Patient has a stable 3 mm left renal stone.  Not experiencing any gross hematuria or flank pain.  He does not desire intervention at this time for the stone.  History of elevated PSA Patient underwent a prostate biopsy on 01/19/2015 for an elevated PSA of 5.4.  This was a significant increase from his previous PSA of 0.8 in 2015.   Biopsy results were benign.  He does not have a family history of prostate cancer.  His most recent PSA was 2.7 ng/mL on 12/2017.    BPH WITH LU TS His I PSS score today is 1, which is mild lower urinary tract symptomatology. He is delighted with his quality life due to his urinary symptoms.  His previous I PSS score is 3/0.  He does not have major complaints at this time.  He denies any dysuria, hematuria or suprapubic pain.  He also denies any recent fevers, chills, nausea or vomiting. IPSS    Row Name 01/06/18 1000         International Prostate Symptom Score   How often have you had the sensation of not emptying your bladder?  Not at All     How often have you had to urinate less than every two hours?  Not at All     How often have you found you stopped and started again several times when you urinated?  Not at All     How often have you found it difficult to postpone urination?  Not at All     How often have you had a weak urinary stream?  Not at All     How often have you had to strain to start urination?  Not at All     How many times did you typically get up at night to urinate?  1 Time     Total IPSS Score  1       Quality of Life due to urinary symptoms   If you were to spend the rest of your life with your urinary condition just the way it is now how would you feel about that?  Delighted        Score:  1-7 Mild 8-19 Moderate 20-35 Severe   PNH: Past Medical History:  Diagnosis Date  . Bladder calculi   . BPH (benign prostatic hyperplasia)   . Hypogonadism in male   . Kidney stone on left side   . Prostatitis   . Urinary hesitancy     Surgical History: Past Surgical History:  Procedure Laterality Date  . Cystolithalopaxy w/Holmium laser    . EXTRACORPOREAL SHOCK WAVE LITHOTRIPSY    . WISDOM TOOTH EXTRACTION      Home Medications:  Allergies as of 01/06/2018      Reactions   Iodinated Diagnostic Agents Rash   Minocycline Rash      Medication List        Accurate as of 01/06/18 11:09 AM. Always use your most recent med list.          ACETAMINOPHEN PO Take by  mouth.   ibuprofen 200 MG tablet Commonly known as:  ADVIL,MOTRIN Take by mouth. Reported on 07/05/2015   OVER THE COUNTER MEDICATION Allergy medication       Allergies:  Allergies  Allergen Reactions  . Iodinated Diagnostic Agents Rash  . Minocycline Rash    Family History: Family History  Problem Relation Age of Onset  . Nephrolithiasis Mother   . Kidney cancer Brother   . Liver cancer Brother   . Squamous cell carcinoma Brother   . Prostate cancer Neg Hx   . Bladder Cancer Neg Hx     Social History:  reports that he has never smoked. He has never used smokeless tobacco. He reports that he drinks alcohol. He reports that he does not use drugs.  ROS: UROLOGY Frequent Urination?: No Hard to postpone urination?: No Burning/pain with urination?: No Get up at night to urinate?: No Leakage of urine?: No Urine stream starts and stops?: No Trouble starting stream?: No Do you have to strain to urinate?: No Blood in urine?: No Urinary tract infection?: No Sexually transmitted disease?: No Injury to kidneys or bladder?: No Painful  intercourse?: No Weak stream?: No Erection problems?: No Penile pain?: No  Gastrointestinal Nausea?: No Vomiting?: No Indigestion/heartburn?: No Diarrhea?: No Constipation?: No  Constitutional Fever: No Night sweats?: No Weight loss?: No Fatigue?: No  Skin Skin rash/lesions?: No Itching?: No  Eyes Blurred vision?: No Double vision?: No  Ears/Nose/Throat Sore throat?: No Sinus problems?: No  Hematologic/Lymphatic Swollen glands?: No Easy bruising?: No  Cardiovascular Leg swelling?: No Chest pain?: No  Respiratory Cough?: No Shortness of breath?: No  Endocrine Excessive thirst?: No  Musculoskeletal Back pain?: No Joint pain?: No  Neurological Headaches?: No Dizziness?: No  Psychologic Depression?: No Anxiety?: No  Physical Exam: BP (!) 148/90 (BP Location: Left Arm, Patient Position: Sitting, Cuff Size: Normal)   Pulse 73   Ht 5\' 10"  (1.778 m)   Wt 188 lb 11.2 oz (85.6 kg)   BMI 27.08 kg/m   Constitutional: Well nourished. Alert and oriented, No acute distress. HEENT: [place] AT, moist mucus membranes. Trachea midline, no masses. Cardiovascular: No clubbing, cyanosis, or edema. Respiratory: Normal respiratory effort, no increased work of breathing. GI: Abdomen is soft, non tender, non distended, no abdominal masses. Liver and spleen not palpable.  No hernias appreciated.  Stool sample for occult testing is not indicated.   GU: No CVA tenderness.  No bladder fullness or masses.  Patient with uncircumcised phallus.  Foreskin easily retracted   Urethral meatus is patent.  No penile discharge. No penile lesions or rashes. Scrotum without lesions, cysts, rashes and/or edema.  Testicles are located scrotally bilaterally. No masses are appreciated in the testicles. Left and right epididymis are normal. Rectal: Patient with  normal sphincter tone. Anus and perineum without scarring or rashes. No rectal masses are appreciated. Prostate is approximately 55 +  grams, could palpate apex and midportion of glands, nonodules are appreciated.  Skin: No rashes, bruises or suspicious lesions. Lymph: No cervical or inguinal adenopathy. Neurologic: Grossly intact, no focal deficits, moving all 4 extremities. Psychiatric: Normal mood and affect.   Laboratory Data: PSA History  5.4 ng/mL on 12/15/2014  4.5 ng/mL on 12/21/2014  biopsy of prostate on 01/26/2015 was negative  2.7 ng/mL on 01/02/2016  Results for orders placed or performed in visit on 12/25/17  PSA  Result Value Ref Range   Prostate Specific Ag, Serum 2.7 0.0 - 4.0 ng/mL   I have reviewed the labs.  Assessment & Plan:    1. History of elevated PSA  - PSA 5.4 in 12/2014 - Biopsy benign  - Recent PSA was 2.7  - Continue with PSA and exam biannually     2. BPH with LUTS  - IPSS score is 1/0, it is improved   - Continue conservative management, avoiding bladder irritants and timed voiding's  - RTC in 6 months for I PSS, PSA and exam   3. Kidney stone:    Stable 3 mm left renal stone.  No intervention requested at this time.     Return in about 1 year (around 01/07/2019) for IPSS, PSA and exam.  These notes generated with voice recognition software. I apologize for typographical errors.  Zara Council, PA-C  [facility] [address] [place],  [address] [phone]

## 2018-01-14 DIAGNOSIS — Z0001 Encounter for general adult medical examination with abnormal findings: Secondary | ICD-10-CM | POA: Diagnosis not present

## 2018-01-14 DIAGNOSIS — Z23 Encounter for immunization: Secondary | ICD-10-CM | POA: Diagnosis not present

## 2018-01-14 DIAGNOSIS — Z0000 Encounter for general adult medical examination without abnormal findings: Secondary | ICD-10-CM | POA: Diagnosis not present

## 2018-01-27 DIAGNOSIS — Z136 Encounter for screening for cardiovascular disorders: Secondary | ICD-10-CM | POA: Diagnosis not present

## 2018-04-21 DIAGNOSIS — H02834 Dermatochalasis of left upper eyelid: Secondary | ICD-10-CM | POA: Diagnosis not present

## 2018-04-21 DIAGNOSIS — H5203 Hypermetropia, bilateral: Secondary | ICD-10-CM | POA: Diagnosis not present

## 2018-04-21 DIAGNOSIS — H52223 Regular astigmatism, bilateral: Secondary | ICD-10-CM | POA: Diagnosis not present

## 2018-04-21 DIAGNOSIS — H2513 Age-related nuclear cataract, bilateral: Secondary | ICD-10-CM | POA: Diagnosis not present

## 2018-04-21 DIAGNOSIS — H524 Presbyopia: Secondary | ICD-10-CM | POA: Diagnosis not present

## 2018-04-21 DIAGNOSIS — H02831 Dermatochalasis of right upper eyelid: Secondary | ICD-10-CM | POA: Diagnosis not present

## 2018-04-27 DIAGNOSIS — L728 Other follicular cysts of the skin and subcutaneous tissue: Secondary | ICD-10-CM | POA: Diagnosis not present

## 2018-04-27 DIAGNOSIS — L570 Actinic keratosis: Secondary | ICD-10-CM | POA: Diagnosis not present

## 2018-04-27 DIAGNOSIS — X32XXXA Exposure to sunlight, initial encounter: Secondary | ICD-10-CM | POA: Diagnosis not present

## 2018-04-27 DIAGNOSIS — D2362 Other benign neoplasm of skin of left upper limb, including shoulder: Secondary | ICD-10-CM | POA: Diagnosis not present

## 2018-10-19 ENCOUNTER — Other Ambulatory Visit: Payer: Self-pay

## 2018-10-19 DIAGNOSIS — Z87442 Personal history of urinary calculi: Secondary | ICD-10-CM

## 2018-10-20 ENCOUNTER — Encounter: Payer: Self-pay | Admitting: Physician Assistant

## 2018-10-20 ENCOUNTER — Ambulatory Visit
Admission: RE | Admit: 2018-10-20 | Discharge: 2018-10-20 | Disposition: A | Discharge status: Home / Self Care | Payer: PPO | Source: Ambulatory Visit | Attending: Physician Assistant | Admitting: Physician Assistant

## 2018-10-20 ENCOUNTER — Telehealth: Payer: Self-pay | Admitting: Physician Assistant

## 2018-10-20 ENCOUNTER — Other Ambulatory Visit: Payer: Self-pay | Admitting: Physician Assistant

## 2018-10-20 ENCOUNTER — Other Ambulatory Visit: Payer: Self-pay

## 2018-10-20 ENCOUNTER — Ambulatory Visit (INDEPENDENT_AMBULATORY_CARE_PROVIDER_SITE_OTHER): Payer: PPO | Admitting: Physician Assistant

## 2018-10-20 VITALS — BP 153/74 | HR 80 | Ht 70.0 in | Wt 190.0 lb

## 2018-10-20 DIAGNOSIS — Z87448 Personal history of other diseases of urinary system: Secondary | ICD-10-CM

## 2018-10-20 DIAGNOSIS — Z87442 Personal history of urinary calculi: Secondary | ICD-10-CM | POA: Diagnosis not present

## 2018-10-20 DIAGNOSIS — K5730 Diverticulosis of large intestine without perforation or abscess without bleeding: Secondary | ICD-10-CM | POA: Diagnosis not present

## 2018-10-20 DIAGNOSIS — N210 Calculus in bladder: Secondary | ICD-10-CM

## 2018-10-20 DIAGNOSIS — R109 Unspecified abdominal pain: Secondary | ICD-10-CM | POA: Diagnosis not present

## 2018-10-20 DIAGNOSIS — N200 Calculus of kidney: Secondary | ICD-10-CM | POA: Diagnosis not present

## 2018-10-20 LAB — URINALYSIS, COMPLETE
Bilirubin, UA: NEGATIVE
Glucose, UA: NEGATIVE
Ketones, UA: NEGATIVE
Leukocytes,UA: NEGATIVE
Nitrite, UA: NEGATIVE
Protein,UA: NEGATIVE
Specific Gravity, UA: >1.03 — ABNORMAL HIGH (ref 1.005–1.030)
Urobilinogen, Ur: 0.2 mg/dL (ref 0.2–1.0)
pH, UA: 5 (ref 5.0–7.5)

## 2018-10-20 LAB — MICROSCOPIC EXAMINATION
Bacteria, UA: NONE SEEN
WBC, UA: NONE SEEN /hpf (ref 0–5)

## 2018-10-20 MED ORDER — TAMSULOSIN HCL 0.4 MG PO CAPS: 0.4000 mg | ORAL_CAPSULE | Freq: Every day | ORAL | 0 refills | Status: DC

## 2018-10-20 MED ORDER — OXYCODONE-ACETAMINOPHEN 5-325 MG PO TABS: 1.0000 | ORAL_TABLET | ORAL | 0 refills | Status: DC | PRN

## 2018-10-20 MED ORDER — ONDANSETRON HCL 4 MG PO TABS: ORAL_TABLET | ORAL | 0 refills | Status: DC

## 2018-10-20 NOTE — Telephone Encounter (Signed)
Please call this patient and inform him that he does not have a kidney stone, so he does not need to take the Tamsulosin at this time. However, his CT revealed a very large bladder stone that might explain his pain and suggests it's time to treat his enlarged prostate. I would like him to schedule a follow-up with Dr. Erlene Quan with cysto to discuss his surgical options.

## 2018-10-20 NOTE — Telephone Encounter (Signed)
Patient notified and voiced understanding. An appointment was scheduled for Cysto.

## 2018-10-20 NOTE — Progress Notes (Signed)
10/20/2018 2:56 PM   [name] [birth year] [id number]  CC: Left flank pain  HPI: Maxwell Jones is a 73 y.o. male who presents to the clinic for evaluation of possible acute stone episode. He is an established [facility] patient who last saw Zara Council on 01/06/2018 for ongoing management of nephrolithiasis, history of elevated PSA, and BPH with LUTS.  Patient does have a history of nephrolithiasis requiring ESWL in the distant past and ureteroscopy circa 2015. He has also achieved spontaneous stone passage in the past.  Patient reports symptom onset Friday morning. He reports intermittent episodes of severe, nonradiating left flank pain, cold sweats, urinary frequency, and nausea without vomiting. He denies gross hematuria, dysuria, and fevers. He has taken Tylenol at home with mild pain relief.  Of note, today's KUB revealed interval enlargement of left lower pole renal stone, now 55mm (increased from 41mm on last KUB on 12/15/2014), position unchanged. UA in office with 1+ blood, RBC 0-2/HPF on microscopy; otherwise normal.   Upon chart review, there is mention of patient having a past history of uric acid stones. I cannot find laboratory data to corroborate this.  PMH: Past Medical History:  Diagnosis Date  . Bladder calculi   . BPH (benign prostatic hyperplasia)   . Hypogonadism in male   . Kidney stone on left side   . Prostatitis   . Urinary hesitancy     Surgical History: Past Surgical History:  Procedure Laterality Date  . Cystolithalopaxy w/Holmium laser    . EXTRACORPOREAL SHOCK WAVE LITHOTRIPSY    . WISDOM TOOTH EXTRACTION      Home Medications:  Allergies as of 10/20/2018      Reactions   Iodinated Diagnostic Agents Rash   Minocycline Rash      Medication List       Accurate as of October 20, 2018  2:56 PM. If you have any questions, ask your nurse or doctor.        ACETAMINOPHEN PO Take by mouth.   ibuprofen 200 MG tablet Commonly known as: ADVIL Take by  mouth. Reported on 07/05/2015   ondansetron 4 MG tablet Commonly known as: Zofran Take 1-2 tablets as needed for nausea. Started by: Debroah Loop, PA-C   OVER THE COUNTER MEDICATION Allergy medication   oxyCODONE-acetaminophen 5-325 MG tablet Commonly known as: PERCOCET/ROXICET Take 1-2 tablets by mouth every 4 (four) hours as needed for severe pain. Started by: Debroah Loop, PA-C   tamsulosin 0.4 MG Caps capsule Commonly known as: FLOMAX Take 1 capsule (0.4 mg total) by mouth daily. Started by: Debroah Loop, PA-C       Allergies:  Allergies  Allergen Reactions  . Iodinated Diagnostic Agents Rash  . Minocycline Rash    Family History: Family History  Problem Relation Age of Onset  . Nephrolithiasis Mother   . Kidney cancer Brother   . Liver cancer Brother   . Squamous cell carcinoma Brother   . Prostate cancer Neg Hx   . Bladder Cancer Neg Hx     Social History:  reports that he has never smoked. He has never used smokeless tobacco. He reports current alcohol use. He reports that he does not use drugs.  ROS: UROLOGY Frequent Urination?: No Hard to postpone urination?: No Burning/pain with urination?: No Get up at night to urinate?: No Leakage of urine?: No Urine stream starts and stops?: No Trouble starting stream?: No Do you have to strain to urinate?: No Blood in urine?: No Urinary tract  infection?: No Sexually transmitted disease?: No Injury to kidneys or bladder?: No Painful intercourse?: No Weak stream?: No Erection problems?: No Penile pain?: No  Gastrointestinal Nausea?: No Vomiting?: No Indigestion/heartburn?: No Diarrhea?: No Constipation?: No  Constitutional Fever: No Night sweats?: No Weight loss?: No Fatigue?: No  Skin Skin rash/lesions?: No Itching?: No  Eyes Blurred vision?: No Double vision?: No  Ears/Nose/Throat Sore throat?: No Sinus problems?: No  Hematologic/Lymphatic Swollen glands?:  No Easy bruising?: No  Cardiovascular Leg swelling?: No Chest pain?: No  Respiratory Cough?: No Shortness of breath?: No  Endocrine Excessive thirst?: No  Musculoskeletal Back pain?: No Joint pain?: No  Neurological Headaches?: No Dizziness?: No  Psychologic Depression?: No Anxiety?: No  Physical Exam: BP (!) 153/74 (BP Location: Left Arm, Patient Position: Sitting, Cuff Size: Normal)   Pulse 80   Ht 5\' 10"  (1.778 m)   Wt 190 lb (86.2 kg)   BMI 27.26 kg/m   Constitutional:  Alert and oriented, No acute distress. No apparent discomfort. HEENT: [place] AT, moist mucus membranes.  Trachea midline, no masses. Cardiovascular: No clubbing, cyanosis, or edema. Respiratory: Normal respiratory effort, no increased work of breathing. Skin: No rashes, bruises or suspicious lesions. Neurologic: Grossly intact, no focal deficits, moving all 4 extremities. Psychiatric: Normal mood and affect.  Laboratory Data: Urinalysis    Component Value Date/Time   APPEARANCEUR Hazy (A) 10/20/2018 0942   GLUCOSEU Negative 10/20/2018 0942   BILIRUBINUR Negative 10/20/2018 0942   PROTEINUR Negative 10/20/2018 0942   NITRITE Negative 10/20/2018 0942   LEUKOCYTESUR Negative 10/20/2018 0942    Results for orders placed or performed in visit on 10/20/18  Microscopic Examination     Status: None   Collection Time: 10/20/18  9:42 AM   URINE  Result Value Ref Range Status   WBC, UA None seen 0 - 5 /hpf Final   RBC 0-2 0 - 2 /hpf Final   Epithelial Cells (non renal) 0-10 0 - 10 /hpf Final   Bacteria, UA None seen None seen/Few Final   Pertinent Imaging: KUB, 10/20/2018: IMPRESSION: Two small lower pole left renal calculi and 2 bladder calculi.   Electronically Signed   By: Claudie Revering M.D.   On: 10/20/2018 14:13  CT Stone study, 10/20/2018: IMPRESSION: 1. Nonobstructive calculi are noted within the collecting system of left kidney measuring up to 6 mm in the lower pole collecting  system. In addition, there are 2 bladder calculi, largest of which measures 1.9 cm in diameter. 2. At this time, there are no ureteral stones or findings to suggest urinary tract obstruction. 3. Colonic diverticulosis without evidence of acute diverticulitis at this time. 4. Additional incidental findings, as above.  Electronically Signed   By: Vinnie Langton M.D.   On: 10/20/2018 11:55  I personally reviewed the images referenced above.  Assessment & Plan:    1. Left flank pain Obtained STAT CT stone study today with concern for possible radiolucent obstructing stone as etiology of current pain given possible history of uric acid stones. No obstruction seen, however patient with very large bladder stone. See #2 below.  Urinalysis in office unconcerning for infection, however given urinary frequency and chills, will obtain culture.   Reviewed PDMP. Sent rx to preferred pharmacy, counseled patient that pain and nausea meds should be taken on an as-needed basis. Patient expressed understanding of this plan.  - Urinalysis, Complete - Urine culture - CT stone study, STAT - Rx sent to [facility]: tamsulosin, Percocet, ondansetron; will counsel patient  that tamsulosin is unnecessary in light of CT findings.  2. Bladder stones Patient's history of bladder stones is suggestive of chronic, incomplete bladder emptying. I suspect the largest stone visualized on CT is responsible for his recent flank pain. Patient with approximate 55g prostate on CT today. I recommend prostate resection at this time. Will refer him to Dr. Erlene Quan for cystoscopic exam for prostate anatomy assessment and recommendations. - Follow up in clinic with Dr. Erlene Quan with cysto  Debroah Loop, PA-C  [facility] [address], [place], [place] [address] [phone]

## 2018-10-22 LAB — CULTURE, URINE COMPREHENSIVE

## 2018-11-16 ENCOUNTER — Other Ambulatory Visit: Payer: Self-pay | Admitting: Physician Assistant

## 2018-11-16 DIAGNOSIS — Z87442 Personal history of urinary calculi: Secondary | ICD-10-CM

## 2018-11-16 DIAGNOSIS — R109 Unspecified abdominal pain: Secondary | ICD-10-CM

## 2018-11-24 ENCOUNTER — Other Ambulatory Visit: Payer: Self-pay

## 2018-11-24 ENCOUNTER — Encounter: Payer: Self-pay | Admitting: Urology

## 2018-11-24 ENCOUNTER — Ambulatory Visit: Payer: PPO | Admitting: Urology

## 2018-11-24 VITALS — BP 136/69 | HR 63 | Ht 70.0 in | Wt 190.0 lb

## 2018-11-24 DIAGNOSIS — Z87442 Personal history of urinary calculi: Secondary | ICD-10-CM | POA: Diagnosis not present

## 2018-11-24 DIAGNOSIS — N200 Calculus of kidney: Secondary | ICD-10-CM

## 2018-11-24 DIAGNOSIS — N400 Enlarged prostate without lower urinary tract symptoms: Secondary | ICD-10-CM

## 2018-11-24 DIAGNOSIS — N210 Calculus in bladder: Secondary | ICD-10-CM | POA: Diagnosis not present

## 2018-11-24 DIAGNOSIS — Z87448 Personal history of other diseases of urinary system: Secondary | ICD-10-CM | POA: Diagnosis not present

## 2018-11-24 LAB — URINALYSIS, COMPLETE
Bilirubin, UA: NEGATIVE
Glucose, UA: NEGATIVE
Ketones, UA: NEGATIVE
Leukocytes,UA: NEGATIVE
Nitrite, UA: NEGATIVE
Protein,UA: NEGATIVE
RBC, UA: NEGATIVE
Specific Gravity, UA: 1.025 (ref 1.005–1.030)
Urobilinogen, Ur: 0.2 mg/dL (ref 0.2–1.0)
pH, UA: 5.5 (ref 5.0–7.5)

## 2018-11-24 LAB — MICROSCOPIC EXAMINATION
Bacteria, UA: NONE SEEN
RBC: NONE SEEN /hpf (ref 0–2)

## 2018-11-24 NOTE — Progress Notes (Signed)
11/24/18  CC:  Chief Complaint  Patient presents with  . Cysto    HPI: 73 year old male with a personal history of recurrent nephrolithiasis who presents today for cystoscopy in light of newly diagnosed bladder stone.  He was seen and evaluated for left flank pain felt to be likely related to a stone episode.  He had a noncontrast CT scan of the abdomen pelvis which revealed a 1.9 cm bladder stone.  He also has nonobstructing left-sided kidney stones up to 6 mm.  No obstructing stones.  Today, he has no urinary symptoms.  In general, he has minimal to no voiding symptoms.  IPSS score is been consistently low.  Not taking any BPH meds.  He does indicate today that he did pass what he believes is a bladder stone about 7 years ago while traveling and had a very difficult time voiding until he finally passed the stone.  Estimated prostate size 63 g by CT scan, 5.0 x 5.5 x 4.4 cm.  Blood pressure 136/69, pulse 63, height 5\' 10"  (1.778 m), weight 190 lb (86.2 kg). NED. A&Ox3.   No respiratory distress   Abd soft, NT, ND Normal phallus with bilateral descended testicles  Cystoscopy Procedure Note  Patient identification was confirmed, informed consent was obtained, and patient was prepped using Betadine solution.  Lidocaine jelly was administered per urethral meatus.     Pre-Procedure: - Inspection reveals a normal caliber ureteral meatus.  Procedure: The flexible cystoscope was introduced without difficulty - No urethral strictures/lesions are present. - Enlarged prostate trilobar coaptation - Normal bladder neck - Bilateral ureteral orifices identified - Bladder mucosa  reveals no ulcers, tumors, or lesions - No bladder stones - No trabeculation  Retroflexion shows bladder stone without significant intravesical protrusion or median lobe of the prostate.   Post-Procedure: - Patient tolerated the procedure well  Assessment/ Plan:  1. Bladder stones Large bladder  stone, will not be able to pass spontaneously  I recommended surgical intervention for the stone in the form of cystolitholapaxy Risk and benefits of this procedure were discussed in detail including risk of bleeding, infection, damage to surrounding structures amongst others.  All questions were answered.  In addition to the above, we discussed the pathophysiology of bladder stones.  We discussed that it is possible that this was a kidney stone which grew in size once it passed into the bladder more likely needs a de novo bladder stone secondary to chronic outlet obstruction.  Quite interestingly he has minimal to no urinary symptoms.  We discussed that often when bladder stones are treated, we offer and recommend treatment of chronic outlet obstruction.  We discussed risk and benefits of proceeding with this at the same time as cystolitholapaxy at length.  We discussed various treatment options including TURP, laser ablative/enucleation type procedures as well as UroLift as possibilities.  Risk and benefits of each were discussed.  He is most interested in proceeding with uro-lift at the same time as cystolitholapaxy.  We discussed the procedure at length including the device, implantation of the device clips, possible complications including infection, damage surrounding structures, stone formation, migration of the implant, bleeding, need for further procedures in the future amongst others.  He was given information about the UroLift procedure.  Outcome data is also reviewed.  All questions were answered. - CULTURE, URINE COMPREHENSIVE  2. BPH without obstruction/lower urinary tract symptoms As above - Urinalysis, Complete  3. Kidney stone on left side Currently asymptomatic We will address at  later date We will likely continue to follow    Schedule cystolitholapaxy, UroLift.  Preop UA/urine culture today.  Discussed need for preop COVID testing as well.  All questions answered.  Hollice Espy, MD

## 2018-11-26 LAB — CULTURE, URINE COMPREHENSIVE

## 2018-12-04 ENCOUNTER — Other Ambulatory Visit: Payer: Self-pay | Admitting: Radiology

## 2018-12-09 ENCOUNTER — Telehealth: Payer: Self-pay | Admitting: Radiology

## 2018-12-09 ENCOUNTER — Other Ambulatory Visit: Payer: Self-pay | Admitting: Radiology

## 2018-12-09 DIAGNOSIS — N401 Enlarged prostate with lower urinary tract symptoms: Secondary | ICD-10-CM

## 2018-12-09 DIAGNOSIS — N138 Other obstructive and reflux uropathy: Secondary | ICD-10-CM

## 2018-12-09 DIAGNOSIS — Z01818 Encounter for other preprocedural examination: Secondary | ICD-10-CM

## 2018-12-09 DIAGNOSIS — N210 Calculus in bladder: Secondary | ICD-10-CM

## 2018-12-09 NOTE — Telephone Encounter (Signed)
Discussed the [facility] Information form below over the phone with patient.    [address], [address] [place], [place] [address] Telephone: [phone] Fax: [phone]   Thank you for choosing [facility] for your upcoming surgery!  We are always here to assist in your urological needs.  Please read the following information with specific details for your upcoming appointments related to your surgery. Please contact [facility] at [phone] Option 3 with any questions.  The Name of Your Surgery: cystolitholapaxy, insertion of urolift Your Surgery Date: 01/11/2019 Your Surgeon: Hollice Espy  Please call Same Day Surgery at [phone] between the hours of 1pm-3pm one day prior to your surgery. They will inform you of the time to arrive at Same Day Surgery which is located on the second floor of the [facility].   Please refer to the attached letter regarding instructions for Pre-Admission Testing. You will receive a call from the [place] office regarding your appointment with them.  The Pre-Admission Testing office is located at [address], on the first floor of the [facility] [facility] in [place] (office is to the right as you enter through the [employer] of the [facility]). Please have all medications you are currently taking and your insurance card available.  A COVID-19 test will be required prior to surgery and once test is performed you will need to remain in quarantine until the day of surgery. Patient was advised to have nothing to eat or drink after midnight the night prior to surgery except that he may have only water until 2 hours before surgery with nothing to drink within 2 hours of surgery.  The patient currently takes no blood thinners. Patient's questions were answered and he expressed understanding  of these instructions.

## 2018-12-31 ENCOUNTER — Other Ambulatory Visit: Payer: Self-pay

## 2018-12-31 ENCOUNTER — Other Ambulatory Visit: Payer: PPO

## 2018-12-31 DIAGNOSIS — N210 Calculus in bladder: Secondary | ICD-10-CM | POA: Diagnosis not present

## 2018-12-31 DIAGNOSIS — N401 Enlarged prostate with lower urinary tract symptoms: Secondary | ICD-10-CM | POA: Diagnosis not present

## 2018-12-31 DIAGNOSIS — Z01818 Encounter for other preprocedural examination: Secondary | ICD-10-CM

## 2018-12-31 DIAGNOSIS — N138 Other obstructive and reflux uropathy: Secondary | ICD-10-CM | POA: Diagnosis not present

## 2018-12-31 LAB — URINALYSIS, COMPLETE
Bilirubin, UA: NEGATIVE
Glucose, UA: NEGATIVE
Ketones, UA: NEGATIVE
Leukocytes,UA: NEGATIVE
Nitrite, UA: NEGATIVE
Specific Gravity, UA: 1.025 (ref 1.005–1.030)
Urobilinogen, Ur: 0.2 mg/dL (ref 0.2–1.0)
pH, UA: 5 (ref 5.0–7.5)

## 2018-12-31 LAB — MICROSCOPIC EXAMINATION

## 2019-01-01 LAB — PSA: Prostate Specific Ag, Serum: 43.9 ng/mL — ABNORMAL HIGH (ref 0.0–4.0)

## 2019-01-03 LAB — CULTURE, URINE COMPREHENSIVE

## 2019-01-04 ENCOUNTER — Telehealth: Payer: Self-pay | Admitting: Urology

## 2019-01-04 NOTE — Telephone Encounter (Signed)
Called patient to discuss his PSA results.  Unfortunately, I had this drawn just following cystoscopy in the setting of a new bladder stone.  I suspect that is elevated markedly secondary to this along with inflammation.  Differential diagnosis includes prostate cancer.  I discussed the findings with the patient extensively.  I recommended that we proceed with rectal exam under anesthesia at the time of his scheduled surgery and prostate biopsy if there is any nodules or suspicious findings on exam.  Risk and benefits of prostate biopsy were discussed in detail.  We will plan to repeat his PSA about 3 months postoperatively to ensure that trending down.  He understands if he does not trend down, will need a prostate biopsy.  We discussed today that if he does have undiagnosed prostate cancer that will need to be treated, he would most likely be a candidate for radiation and treating his BPH symptoms of bladder stone will not preclude him, may in fact help him tolerate this down the road.  All questions answered today.  He is agreeable this plan.  Hollice Espy, MD

## 2019-01-05 ENCOUNTER — Other Ambulatory Visit: Payer: Self-pay | Admitting: Urology

## 2019-01-05 ENCOUNTER — Other Ambulatory Visit: Payer: Self-pay | Admitting: Radiology

## 2019-01-05 DIAGNOSIS — N400 Enlarged prostate without lower urinary tract symptoms: Secondary | ICD-10-CM

## 2019-01-07 ENCOUNTER — Ambulatory Visit: Payer: PPO | Admitting: Urology

## 2019-01-07 ENCOUNTER — Other Ambulatory Visit: Payer: Self-pay

## 2019-01-07 ENCOUNTER — Encounter
Admission: RE | Admit: 2019-01-07 | Discharge: 2019-01-07 | Disposition: A | Discharge status: Home / Self Care | Payer: PPO | Source: Ambulatory Visit | Attending: Urology | Admitting: Urology

## 2019-01-07 DIAGNOSIS — Z20828 Contact with and (suspected) exposure to other viral communicable diseases: Secondary | ICD-10-CM | POA: Diagnosis not present

## 2019-01-07 DIAGNOSIS — Z01810 Encounter for preprocedural cardiovascular examination: Secondary | ICD-10-CM | POA: Diagnosis not present

## 2019-01-07 DIAGNOSIS — Z01818 Encounter for other preprocedural examination: Secondary | ICD-10-CM | POA: Diagnosis not present

## 2019-01-07 HISTORY — DX: Personal history of urinary calculi: Z87.442

## 2019-01-07 LAB — BASIC METABOLIC PANEL
Anion gap: 3 — ABNORMAL LOW (ref 5–15)
BUN: 26 mg/dL — ABNORMAL HIGH (ref 8–23)
CO2: 29 mmol/L (ref 22–32)
Calcium: 8.7 mg/dL — ABNORMAL LOW (ref 8.9–10.3)
Chloride: 109 mmol/L (ref 98–111)
Creatinine, Ser: 0.96 mg/dL (ref 0.61–1.24)
GFR calc Af Amer: >60 mL/min (ref >60)
GFR calc non Af Amer: >60 mL/min (ref >60)
Glucose, Bld: 103 mg/dL — ABNORMAL HIGH (ref 70–99)
Potassium: 4.4 mmol/L (ref 3.5–5.1)
Sodium: 141 mmol/L (ref 135–145)

## 2019-01-07 LAB — CBC
HCT: 44.5 % (ref 39.0–52.0)
Hemoglobin: 15 g/dL (ref 13.0–17.0)
MCH: 31.4 pg (ref 26.0–34.0)
MCHC: 33.7 g/dL (ref 30.0–36.0)
MCV: 93.3 fL (ref 80.0–100.0)
Platelets: 278 10*3/uL (ref 150–400)
RBC: 4.77 MIL/uL (ref 4.22–5.81)
RDW: 12.4 % (ref 11.5–15.5)
WBC: 4.2 10*3/uL (ref 4.0–10.5)
nRBC: 0 % (ref 0.0–0.2)

## 2019-01-07 LAB — SARS CORONAVIRUS 2 (TAT 6-24 HRS): SARS Coronavirus 2: NEGATIVE

## 2019-01-07 NOTE — Progress Notes (Signed)
Pre-Admit Testing Provider Notification Note  Provider Notified: Dr. Erlene Quan  Notification Mode: Fax  Reason: Abnormal BMP results.  Response: Fax confirmation received  Additional Information: Placed on chart.  Signed: Beulah Gandy, RN

## 2019-01-07 NOTE — Patient Instructions (Signed)
Your procedure is scheduled on: Monday 01/11/19  Report to [facility]. To find out your arrival time please call [phone] between 1PM - 3PM on Friday 01/08/19.   Remember: Instructions that are not followed completely may result in serious medical risk, up to and including death, or upon the discretion of your surgeon and anesthesiologist your surgery may need to be rescheduled.      _X__ 1. Do not eat food after midnight the night before your procedure.                 No gum chewing or hard candies. You may drink clear liquids up to 2 hours                 before you are scheduled to arrive for your surgery- DO NOT drink clear                 liquids within 2 hours of the start of your surgery.                 Clear Liquids include:  water, apple juice without pulp, clear carbohydrate                 drink such as Clearfast or Gatorade, Black Coffee or Tea (Do not add                 milk or creamer to coffee or tea).  __X__2.  On the morning of surgery brush your teeth with toothpaste and water, you may rinse your mouth with mouthwash if you wish.  Do not swallow any toothpaste or mouthwash.     _X__ 3.  No Alcohol for 24 hours before or after surgery.    __X__4.  Notify your doctor if there is any change in your medical condition      (cold, fever, infections).      Do not wear jewelry, make-up, hairpins, clips or nail polish. Do not wear lotions, powders, or perfumes.  Do not shave 48 hours prior to surgery. Men may shave face and neck. Do not bring valuables to the hospital.     [facility] is not responsible for any belongings or valuables.   Contacts, dentures/partials or body piercings may not be worn into surgery. Bring a case for your contacts, glasses or hearing aids, a denture cup will be supplied.     Patients discharged the day of surgery will not be allowed to drive home.    __X__ Take these  medicines the morning of surgery with A SIP OF WATER:     1. acetaminophen (TYLENOL) 325 MG tablet if needed  2. oxyCODONE-acetaminophen (PERCOCET/ROXICET) 5-325 MG tablet if needed       __X__ Stop Anti-inflammatories 7 days before surgery such as Advil, Ibuprofen, Motrin, BC or Goodies Powder, Naprosyn, Naproxen, Aleve, Aspirin, Meloxicam. May take Tylenol if needed for pain or discomfort.     __X__ Don't begin taking any new herbal supplements before your procedure.

## 2019-01-11 ENCOUNTER — Ambulatory Visit: Payer: PPO | Admitting: Certified Registered"

## 2019-01-11 ENCOUNTER — Other Ambulatory Visit: Payer: Self-pay

## 2019-01-11 ENCOUNTER — Ambulatory Visit
Admission: RE | Admit: 2019-01-11 | Discharge: 2019-01-11 | Disposition: A | Discharge status: Home / Self Care | Payer: PPO | Attending: Urology | Admitting: Urology

## 2019-01-11 ENCOUNTER — Ambulatory Visit
Admission: RE | Admit: 2019-01-11 | Discharge: 2019-01-11 | Disposition: A | Discharge status: Home / Self Care | Payer: PPO | Source: Ambulatory Visit | Attending: Urology | Admitting: Urology

## 2019-01-11 ENCOUNTER — Encounter: Payer: Self-pay | Admitting: *Deleted

## 2019-01-11 ENCOUNTER — Encounter
Admission: RE | Disposition: A | Discharge status: Home / Self Care | Payer: Self-pay | Source: Home / Self Care | Attending: Urology

## 2019-01-11 DIAGNOSIS — R972 Elevated prostate specific antigen [PSA]: Secondary | ICD-10-CM | POA: Diagnosis not present

## 2019-01-11 DIAGNOSIS — N3289 Other specified disorders of bladder: Secondary | ICD-10-CM | POA: Diagnosis not present

## 2019-01-11 DIAGNOSIS — N210 Calculus in bladder: Secondary | ICD-10-CM

## 2019-01-11 DIAGNOSIS — N200 Calculus of kidney: Secondary | ICD-10-CM | POA: Insufficient documentation

## 2019-01-11 DIAGNOSIS — N138 Other obstructive and reflux uropathy: Secondary | ICD-10-CM | POA: Insufficient documentation

## 2019-01-11 DIAGNOSIS — N401 Enlarged prostate with lower urinary tract symptoms: Secondary | ICD-10-CM | POA: Insufficient documentation

## 2019-01-11 DIAGNOSIS — R109 Unspecified abdominal pain: Secondary | ICD-10-CM

## 2019-01-11 DIAGNOSIS — N400 Enlarged prostate without lower urinary tract symptoms: Secondary | ICD-10-CM

## 2019-01-11 DIAGNOSIS — N320 Bladder-neck obstruction: Secondary | ICD-10-CM | POA: Diagnosis not present

## 2019-01-11 DIAGNOSIS — Z87442 Personal history of urinary calculi: Secondary | ICD-10-CM

## 2019-01-11 HISTORY — PX: CYSTOSCOPY WITH LITHOLAPAXY: SHX1425

## 2019-01-11 HISTORY — PX: CYSTOSCOPY WITH INSERTION OF UROLIFT: SHX6678

## 2019-01-11 SURGERY — CYSTOSCOPY WITH INSERTION OF UROLIFT
Anesthesia: General | Site: Prostate

## 2019-01-11 MED ORDER — ONDANSETRON HCL 4 MG/2ML IJ SOLN
INTRAMUSCULAR | Status: AC
  Filled 2019-01-11: qty 2

## 2019-01-11 MED ORDER — FENTANYL CITRATE (PF) 100 MCG/2ML IJ SOLN: 25.0000 ug | INTRAMUSCULAR | Status: DC | PRN

## 2019-01-11 MED ORDER — FENTANYL CITRATE (PF) 100 MCG/2ML IJ SOLN
INTRAMUSCULAR | Status: AC
  Filled 2019-01-11: qty 2

## 2019-01-11 MED ORDER — OXYBUTYNIN CHLORIDE 5 MG PO TABS: 5.0000 mg | ORAL_TABLET | Freq: Three times a day (TID) | ORAL | 0 refills | Status: DC | PRN

## 2019-01-11 MED ORDER — PROPOFOL 10 MG/ML IV BOLUS
INTRAVENOUS | Status: DC | PRN
  Administered 2019-01-11: 50 mg via INTRAVENOUS
  Administered 2019-01-11: 150 mg via INTRAVENOUS

## 2019-01-11 MED ORDER — OXYBUTYNIN CHLORIDE 5 MG PO TABS
ORAL_TABLET | ORAL | Status: AC
  Filled 2019-01-11: qty 1

## 2019-01-11 MED ORDER — OXYCODONE-ACETAMINOPHEN 5-325 MG PO TABS: 1.0000 | ORAL_TABLET | ORAL | 0 refills | Status: DC | PRN

## 2019-01-11 MED ORDER — CEFAZOLIN SODIUM-DEXTROSE 2-4 GM/100ML-% IV SOLN
2.0000 g | INTRAVENOUS | Status: AC
  Administered 2019-01-11: 2 g via INTRAVENOUS

## 2019-01-11 MED ORDER — OXYBUTYNIN CHLORIDE 5 MG PO TABS
5.0000 mg | ORAL_TABLET | Freq: Once | ORAL | Status: AC
  Administered 2019-01-11: 5 mg via ORAL

## 2019-01-11 MED ORDER — MIDAZOLAM HCL 2 MG/2ML IJ SOLN
INTRAMUSCULAR | Status: DC | PRN
  Administered 2019-01-11 (×2): 1 mg via INTRAVENOUS

## 2019-01-11 MED ORDER — PROPOFOL 10 MG/ML IV BOLUS
INTRAVENOUS | Status: AC
  Filled 2019-01-11: qty 20

## 2019-01-11 MED ORDER — FAMOTIDINE 20 MG PO TABS
20.0000 mg | ORAL_TABLET | Freq: Once | ORAL | Status: AC
  Administered 2019-01-11: 06:00:00 20 mg via ORAL

## 2019-01-11 MED ORDER — EPHEDRINE SULFATE 50 MG/ML IJ SOLN
INTRAMUSCULAR | Status: DC | PRN
  Administered 2019-01-11 (×3): 10 mg via INTRAVENOUS

## 2019-01-11 MED ORDER — ONDANSETRON HCL 4 MG/2ML IJ SOLN
INTRAMUSCULAR | Status: DC | PRN
  Administered 2019-01-11: 4 mg via INTRAVENOUS

## 2019-01-11 MED ORDER — FENTANYL CITRATE (PF) 100 MCG/2ML IJ SOLN
INTRAMUSCULAR | Status: DC | PRN
  Administered 2019-01-11 (×2): 25 ug via INTRAVENOUS

## 2019-01-11 MED ORDER — LIDOCAINE HCL (CARDIAC) PF 100 MG/5ML IV SOSY
PREFILLED_SYRINGE | INTRAVENOUS | Status: DC | PRN
  Administered 2019-01-11: 80 mg via INTRAVENOUS

## 2019-01-11 MED ORDER — MIDAZOLAM HCL 2 MG/2ML IJ SOLN
INTRAMUSCULAR | Status: AC
  Filled 2019-01-11: qty 2

## 2019-01-11 MED ORDER — ONDANSETRON HCL 4 MG/2ML IJ SOLN: 4.0000 mg | Freq: Once | INTRAMUSCULAR | Status: DC | PRN

## 2019-01-11 MED ORDER — LACTATED RINGERS IV SOLN
INTRAVENOUS | Status: DC
  Administered 2019-01-11: 07:00:00 via INTRAVENOUS

## 2019-01-11 MED ORDER — LIDOCAINE HCL (PF) 2 % IJ SOLN
INTRAMUSCULAR | Status: AC
  Filled 2019-01-11: qty 10

## 2019-01-11 MED ORDER — DEXAMETHASONE SODIUM PHOSPHATE 10 MG/ML IJ SOLN
INTRAMUSCULAR | Status: DC | PRN
  Administered 2019-01-11: 10 mg via INTRAVENOUS

## 2019-01-11 MED ORDER — OXYCODONE-ACETAMINOPHEN 5-325 MG PO TABS
1.0000 | ORAL_TABLET | ORAL | Status: DC | PRN
  Administered 2019-01-11: 1 via ORAL

## 2019-01-11 MED ORDER — DEXAMETHASONE SODIUM PHOSPHATE 10 MG/ML IJ SOLN
INTRAMUSCULAR | Status: AC
  Filled 2019-01-11: qty 1

## 2019-01-11 MED ORDER — OXYCODONE-ACETAMINOPHEN 5-325 MG PO TABS
ORAL_TABLET | ORAL | Status: AC
  Filled 2019-01-11: qty 1

## 2019-01-11 MED ORDER — FAMOTIDINE 20 MG PO TABS
ORAL_TABLET | ORAL | Status: AC
  Administered 2019-01-11: 20 mg via ORAL
  Filled 2019-01-11: qty 1

## 2019-01-11 MED ORDER — CEFAZOLIN SODIUM-DEXTROSE 2-4 GM/100ML-% IV SOLN
INTRAVENOUS | Status: AC
  Filled 2019-01-11: qty 100

## 2019-01-11 SURGICAL SUPPLY — 34 items
BAG DRAIN CYSTO-URO LG1000N (MISCELLANEOUS) ×5 IMPLANT
BAG URINE DRAINAGE (UROLOGICAL SUPPLIES) ×5 IMPLANT
BASKET ZERO TIP 1.9FR (BASKET) ×5 IMPLANT
BLADE CLIPPER SURG (BLADE) IMPLANT
CATH FOL 2WAY LX 20X30 (CATHETERS) ×5 IMPLANT
COVER WAND RF STERILE (DRAPES) IMPLANT
DRAPE 3/4 80X56 (DRAPES) ×5 IMPLANT
DRSG TELFA 3X8 NADH (GAUZE/BANDAGES/DRESSINGS) ×5 IMPLANT
DRSG TELFA 4X3 1S NADH ST (GAUZE/BANDAGES/DRESSINGS) ×5 IMPLANT
FIBER LASER 550 (Laser) ×5 IMPLANT
GAUZE SPONGE 4X4 12PLY STRL (GAUZE/BANDAGES/DRESSINGS) ×5 IMPLANT
GLOVE BIO SURGEON STRL SZ 6.5 (GLOVE) ×4 IMPLANT
GLOVE BIO SURGEON STRL SZ7 (GLOVE) ×10 IMPLANT
GLOVE BIO SURGEONS STRL SZ 6.5 (GLOVE) ×1
GOWN STRL REUS W/ TWL LRG LVL3 (GOWN DISPOSABLE) ×6 IMPLANT
GOWN STRL REUS W/TWL LRG LVL3 (GOWN DISPOSABLE) ×4
GUIDE NEEDLE ENDOCAV 16-18 CVR (NEEDLE) ×5 IMPLANT
INST BIOPSY MAXCORE 18GX25 (NEEDLE) IMPLANT
KIT TURNOVER CYSTO (KITS) ×5 IMPLANT
NDL DEFLUX 3.7X23X350 (MISCELLANEOUS)
NDL SAFETY ECLIPSE 18X1.5 (NEEDLE) ×3 IMPLANT
NEEDLE DEFLUX 3.7X23X350 (MISCELLANEOUS) IMPLANT
NEEDLE GUIDE BIOPSY 644068 (NEEDLE) IMPLANT
NEEDLE HYPO 18GX1.5 SHARP (NEEDLE) ×2
NEEDLE HYPO 22GX1.5 SAFETY (NEEDLE) ×5 IMPLANT
PACK CYSTO AR (MISCELLANEOUS) ×5 IMPLANT
SET CYSTO W/LG BORE CLAMP LF (SET/KITS/TRAYS/PACK) ×5 IMPLANT
SET IRRIG Y TYPE TUR BLADDER L (SET/KITS/TRAYS/PACK) ×5 IMPLANT
SURGILUBE 2OZ TUBE FLIPTOP (MISCELLANEOUS) ×5 IMPLANT
SYR 10ML LL (SYRINGE) ×5 IMPLANT
SYRINGE IRR TOOMEY STRL 70CC (SYRINGE) ×5 IMPLANT
SYSTEM UROLIFT (Male Continence) ×20 IMPLANT
WATER STERILE IRR 1000ML POUR (IV SOLUTION) ×5 IMPLANT
WATER STERILE IRR 3000ML UROMA (IV SOLUTION) ×20 IMPLANT

## 2019-01-11 NOTE — H&P (Signed)
11/24/18  --=> updated 01/11/19 [facility] Preop PSA elevated, however, performed just in setting of bladder stone/ voiding symptoms Plan for rectal exam under anesthesia, possible prostate biopsy if abnormal, otherwise will repeat PSA in a few months and reassess   CC:     Chief Complaint  Patient presents with  . Cysto    HPI: 73 year old male with a personal history of recurrent nephrolithiasis who presents today for cystoscopy in light of newly diagnosed bladder stone.  He was seen and evaluated for left flank pain felt to be likely related to a stone episode.  He had a noncontrast CT scan of the abdomen pelvis which revealed a 1.9 cm bladder stone.  He also has nonobstructing left-sided kidney stones up to 6 mm.  No obstructing stones.  Today, he has no urinary symptoms.  In general, he has minimal to no voiding symptoms.  IPSS score is been consistently low.  Not taking any BPH meds.  He does indicate today that he did pass what he believes is a bladder stone about 7 years ago while traveling and had a very difficult time voiding until he finally passed the stone.  Estimated prostate size 63 g by CT scan, 5.0 x 5.5 x 4.4 cm.  Blood pressure 136/69, pulse 63, height 5\' 10"  (1.778 m), weight 190 lb (86.2 kg). NED. A&Ox3.   No respiratory distress   Abd soft, NT, ND Normal phallus with bilateral descended testicles  Cystoscopy Procedure Note  Patient identification was confirmed, informed consent was obtained, and patient was prepped using Betadine solution.  Lidocaine jelly was administered per urethral meatus.     Pre-Procedure: - Inspection reveals a normal caliber ureteral meatus.  Procedure: The flexible cystoscope was introduced without difficulty - No urethral strictures/lesions are present. - Enlarged prostate trilobar coaptation - Normal bladder neck - Bilateral ureteral orifices identified - Bladder mucosa  reveals no ulcers, tumors, or  lesions - No bladder stones - No trabeculation  Retroflexion shows bladder stone without significant intravesical protrusion or median lobe of the prostate.   Post-Procedure: - Patient tolerated the procedure well  Assessment/ Plan:  1. Bladder stones Large bladder stone, will not be able to pass spontaneously  I recommended surgical intervention for the stone in the form of cystolitholapaxy Risk and benefits of this procedure were discussed in detail including risk of bleeding, infection, damage to surrounding structures amongst others.  All questions were answered.  In addition to the above, we discussed the pathophysiology of bladder stones.  We discussed that it is possible that this was a kidney stone which grew in size once it passed into the bladder more likely needs a de novo bladder stone secondary to chronic outlet obstruction.  Quite interestingly he has minimal to no urinary symptoms.  We discussed that often when bladder stones are treated, we offer and recommend treatment of chronic outlet obstruction.  We discussed risk and benefits of proceeding with this at the same time as cystolitholapaxy at length.  We discussed various treatment options including TURP, laser ablative/enucleation type procedures as well as UroLift as possibilities.  Risk and benefits of each were discussed.  He is most interested in proceeding with uro-lift at the same time as cystolitholapaxy.  We discussed the procedure at length including the device, implantation of the device clips, possible complications including infection, damage surrounding structures, stone formation, migration of the implant, bleeding, need for further procedures in the future amongst others.  He was given information about  the UroLift procedure.  Outcome data is also reviewed.  All questions were answered. 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 Kidney stone on left side Currently asymptomatic We will address at later date We will likely continue to follow    Schedule cystolitholapaxy, UroLift.  Preop UA/urine culture today.  Discussed need for preop COVID testing as well.  All questions answered.  Hollice Espy, MD

## 2019-01-11 NOTE — Anesthesia Procedure Notes (Signed)
Procedure Name: LMA Insertion Date/Time: 01/11/2019 7:47 AM Performed by: Jerrye Noble, CRNA Pre-anesthesia Checklist: Patient identified, Emergency Drugs available, Suction available, Patient being monitored and Timeout performed Patient Re-evaluated:Patient Re-evaluated prior to induction Oxygen Delivery Method: Circle system utilized Preoxygenation: Pre-oxygenation with 100% oxygen Induction Type: IV induction Ventilation: Mask ventilation without difficulty LMA: LMA inserted LMA Size: 4.5 Number of attempts: 1 Placement Confirmation: positive ETCO2 and breath sounds checked- equal and bilateral Tube secured with: Tape Dental Injury: Teeth and Oropharynx as per pre-operative assessment

## 2019-01-11 NOTE — Anesthesia Preprocedure Evaluation (Signed)
Anesthesia Evaluation  Patient identified by MRN, date of birth, ID band Patient awake    Reviewed: Allergy & Precautions, H&P , NPO status , Patient's Chart, lab work & pertinent test results, reviewed documented beta blocker date and time   History of Anesthesia Complications Negative for: history of anesthetic complications  Airway Mallampati: III  TM Distance: >3 FB Neck ROM: full    Dental  (+) Dental Advidsory Given, Caps   Pulmonary neg pulmonary ROS,    Pulmonary exam normal        Cardiovascular Exercise Tolerance: Good negative cardio ROS Normal cardiovascular exam     Neuro/Psych negative neurological ROS  negative psych ROS   GI/Hepatic Neg liver ROS, GERD  ,  Endo/Other  negative endocrine ROS  Renal/GU Renal disease (kidney stones)  negative genitourinary   Musculoskeletal   Abdominal   Peds  Hematology negative hematology ROS (+)   Anesthesia Other Findings Past Medical History: No date: Bladder calculi No date: BPH (benign prostatic hyperplasia) No date: History of kidney stones No date: Hypogonadism in male No date: Kidney stone on left side No date: Prostatitis No date: Urinary hesitancy   Reproductive/Obstetrics negative OB ROS                             Anesthesia Physical Anesthesia Plan  ASA: II  Anesthesia Plan: General   Post-op Pain Management:    Induction: Intravenous  PONV Risk Score and Plan: 2 and Ondansetron, Dexamethasone and Treatment may vary due to age or medical condition  Airway Management Planned: LMA and Oral ETT  Additional Equipment:   Intra-op Plan:   Post-operative Plan: Extubation in OR  Informed Consent: I have reviewed the patients History and Physical, chart, labs and discussed the procedure including the risks, benefits and alternatives for the proposed anesthesia with the patient or authorized representative who has  indicated his/her understanding and acceptance.     Dental Advisory Given  Plan Discussed with: Anesthesiologist, CRNA and Surgeon  Anesthesia Plan Comments:         Anesthesia Quick Evaluation

## 2019-01-11 NOTE — Transfer of Care (Signed)
Immediate Anesthesia Transfer of Care Note  Patient: Maxwell Jones  Procedure(s) Performed: CYSTOSCOPY WITH INSERTION OF UROLIFT (N/A Prostate) CYSTOSCOPY WITH LITHOLAPAXY (N/A Bladder) EXAM UNDER ANESTHESIA (N/A Prostate)  Patient Location: PACU  Anesthesia Type:General  Level of Consciousness: drowsy  Airway & Oxygen Therapy: Patient Spontanous Breathing and Patient connected to face mask oxygen  Post-op Assessment: Report given to RN and Post -op Vital signs reviewed and stable  Post vital signs: Reviewed and stable  Last Vitals:  Vitals Value Taken Time  BP 122/57 01/11/19 0849  Temp    Pulse 66 01/11/19 0851  Resp 8 01/11/19 0851  SpO2 100 % 01/11/19 0851  Vitals shown include unvalidated device data.  Last Pain:  Vitals:   01/11/19 0850  TempSrc:   PainSc: (P) Asleep         Complications: No apparent anesthesia complications

## 2019-01-11 NOTE — Anesthesia Post-op Follow-up Note (Signed)
Anesthesia QCDR form completed.        

## 2019-01-11 NOTE — Op Note (Signed)
Date of procedure: 01/11/19  Preoperative diagnosis:  1. Bladder stone 2. BPH with bladder outlet obstruction 3. Elevated PSA  Postoperative diagnosis:  1. Same as above  Procedure: 1. Cystolitholapaxy, 2 cm bladder stone 2. UroLift procedure with 4 implants 3. Rectal exam under anesthesia  Surgeon: Hollice Espy, MD  Anesthesia: General  Complications: None  Intraoperative findings: 2 cm bladder stone, trabeculated bladder.  Relatively short prostatic length with bilobar coaptation, anterior channel created using 4 implanted UroLift devices.  EBL: Minimal  Specimens: None  Drains: 20 French two-way Foley catheter  Indication: Maxwell Jones is a 73 y.o. patient with recurrent bladder stones and cystoscopic evidence of bladder outlet obstruction.  After reviewing the management options for treatment, he elected to proceed with the above surgical procedure(s). We have discussed the potential benefits and risks of the procedure, side effects of the proposed treatment, the likelihood of the patient achieving the goals of the procedure, and any potential problems that might occur during the procedure or recuperation. Informed consent has been obtained.  Description of procedure:  The patient was taken to the operating room and general anesthesia was induced.  The patient was placed in the dorsal lithotomy position, prepped and draped in the usual sterile fashion, and preoperative antibiotics were administered. A preoperative time-out was performed.   A 21 French sheath cystoscope was inserted transurethrally into the bladder.  Notably, the prostatic fossa was somewhat narrow but relatively short with bilobar coaptation.  Within the bladder, a 2 cm bladder stone was identified.  There is mild trabeculation.  The bladder neck was not particularly elevated.  The decision was made to transition to a 35 French resectoscope to be able to use continuous irrigation.  Using a 550 m laser fiber in  the settings of 2 J and 20 Hz, the stone was fragmented into a few smaller pieces.  I initially tried to use a basket to pull these pieces and block was not successful in doing so.  I pushed the stone pieces back in and further obliterated the stone until all the fragments were able to be irrigated out the bladder.  There was some slight mucosal disruption of the posterior wall from a small misfire of the laser which appeared superficial and small with only small amount of bleeding.  Remainder of the bladder mucosa was intact.  Next, the scope was removed and the UroLift 0 degree lens scope was inserted without difficulty.  Turning the device laterally, a total of 4 implanted devices were placed within the prostatic fossa 1.5 cm from the bladder neck bilaterally to open anterior channel.  A second set was placed in a similar anterior lateral position near the apex of the prostate.  This opened up a wide prostatic fossa to which the scope was easily able to navigate and resolved the lobar coaptation almost completely.  Once the 4 implant devices were in satisfactory position, the scope was removed.  Due to some mucosal disruption in the amount of manipulation, I was concerned for high risk of postoperative urinary retention.  As such, Foley catheter was placed easily into the bladder and the balloon was filled with 10 cc of sterile water.  The patient was then cleaned and dried, repositioned supine position, versus seizure, taken the PACU in stable condition.  There were no complications in this procedure.  Plan: He will return in 2 days to the office for voiding trial.  Hollice Espy, M.D.

## 2019-01-11 NOTE — Discharge Instructions (Signed)
AMBULATORY SURGERY  DISCHARGE INSTRUCTIONS   1) The drugs that you were given will stay in your system until tomorrow so for the next 24 hours you should not:  A) Drive an automobile B) Make any legal decisions C) Drink any alcoholic beverage   2) You may resume regular meals tomorrow.  Today it is better to start with liquids and gradually work up to solid foods.  You may eat anything you prefer, but it is better to start with liquids, then soup and crackers, and gradually work up to solid foods.   3) Please notify your doctor immediately if you have any unusual bleeding, trouble breathing, redness and pain at the surgery site, drainage, fever, or pain not relieved by medication. 4)   5) Your post-operative visit with Dr.                                     is: Date:                        Time:    Please call to schedule your post-operative visit.  6) Additional Instructions:    Indwelling Urinary Catheter Care, Adult An indwelling urinary catheter is a thin tube that is put into your bladder. The tube helps to drain pee (urine) out of your body. The tube goes in through your urethra. Your urethra is where pee comes out of your body. Your pee will come out through the catheter, then it will go into a bag (drainage bag). Take good care of your catheter so it will work well. How to wear your catheter and bag Supplies needed  Sticky tape (adhesive tape) or a leg strap.  Alcohol wipe or soap and water (if you use tape).  A clean towel (if you use tape).  Large overnight bag.  Smaller bag (leg bag). Wearing your catheter Attach your catheter to your leg with tape or a leg strap.  Make sure the catheter is not pulled tight.  If a leg strap gets wet, take it off and put on a dry strap.  If you use tape to hold the bag on your leg: 1. Use an alcohol wipe or soap and water to wash your skin where the tape made it sticky before. 2. Use a clean towel to pat-dry that  skin. 3. Use new tape to make the bag stay on your leg. Wearing your bags You should have been given a large overnight bag.  You may wear the overnight bag in the day or night.  Always have the overnight bag lower than your bladder.  Do not let the bag touch the floor.  Before you go to sleep, put a clean plastic bag in a wastebasket. Then hang the overnight bag inside the wastebasket. You should also have a smaller leg bag that fits under your clothes.  Always wear the leg bag below your knee.  Do not wear your leg bag at night. How to care for your skin and catheter Supplies needed  A clean washcloth.  Water and mild soap.  A clean towel. Caring for your skin and catheter      Clean the skin around your catheter every day: ? Wash your hands with soap and water. ? Wet a clean washcloth in warm water and mild soap. ? Clean the skin around your urethra. ? If you are male: ?  Gently spread the folds of skin around your vagina (labia). ? With the washcloth in your other hand, wipe the inner side of your labia on each side. Wipe from front to back. ? If you are male: ? Pull back any skin that covers the end of your penis (foreskin). ? With the washcloth in your other hand, wipe your penis in small circles. Start wiping at the tip of your penis, then move away from the catheter. ? Move the foreskin back in place, if needed. ? With your free hand, hold the catheter close to where it goes into your body. ? Keep holding the catheter during cleaning so it does not get pulled out. ? With the washcloth in your other hand, clean the catheter. ? Only wipe downward on the catheter. ? Do not wipe upward toward your body. Doing this may push germs into your urethra and cause infection. ? Use a clean towel to pat-dry the catheter and the skin around it. Make sure to wipe off all soap. ? Wash your hands with soap and water.  Shower every day. Do not take baths.  Do not use cream,  ointment, or lotion on the area where the catheter goes into your body, unless your doctor tells you to.  Do not use powders, sprays, or lotions on your genital area.  Check your skin around the catheter every day for signs of infection. Check for: ? Redness, swelling, or pain. ? Fluid or blood. ? Warmth. ? Pus or a bad smell. How to empty the bag Supplies needed  Rubbing alcohol.  Gauze pad or cotton ball.  Tape or a leg strap. Emptying the bag Pour the pee out of your bag when it is ?- full, or at least 2-3 times a day. Do this for your overnight bag and your leg bag. 1. Wash your hands with soap and water. 2. Separate (detach) the bag from your leg. 3. Hold the bag over the toilet or a clean pail. Keep the bag lower than your hips and bladder. This is so the pee (urine) does not go back into the tube. 4. Open the pour spout. It is at the bottom of the bag. 5. Empty the pee into the toilet or pail. Do not let the pour spout touch any surface. 6. Put rubbing alcohol on a gauze pad or cotton ball. 7. Use the gauze pad or cotton ball to clean the pour spout. 8. Close the pour spout. 9. Attach the bag to your leg with tape or a leg strap. 10. Wash your hands with soap and water. Follow instructions for cleaning the drainage bag:  From the product maker.  As told by your doctor. How to change the bag Supplies needed  Alcohol wipes.  A clean bag.  Tape or a leg strap. Changing the bag Replace your bag when it starts to leak, smell bad, or look dirty. 1. Wash your hands with soap and water. 2. Separate the dirty bag from your leg. 3. Pinch the catheter with your fingers so that pee does not spill out. 4. Separate the catheter tube from the bag tube where these tubes connect (at the connection valve). Do not let the tubes touch any surface. 5. Clean the end of the catheter tube with an alcohol wipe. Use a different alcohol wipe to clean the end of the bag tube. 6. Connect  the catheter tube to the tube of the clean bag. 7. Attach the clean bag to your leg  with tape or a leg strap. Do not make the bag tight on your leg. 8. Wash your hands with soap and water. General rules   Never pull on your catheter. Never try to take it out. Doing that can hurt you.  Always wash your hands before and after you touch your catheter or bag. Use a mild, fragrance-free soap. If you do not have soap and water, use hand sanitizer.  Always make sure there are no twists or bends (kinks) in the catheter tube.  Always make sure there are no leaks in the catheter or bag.  Drink enough fluid to keep your pee pale yellow.  Do not take baths, swim, or use a hot tub.  If you are male, wipe from front to back after you poop (have a bowel movement). Contact a doctor if:  Your pee is cloudy.  Your pee smells worse than usual.  Your catheter gets clogged.  Your catheter leaks.  Your bladder feels full. Get help right away if:  You have redness, swelling, or pain where the catheter goes into your body.  You have fluid, blood, pus, or a bad smell coming from the area where the catheter goes into your body.  Your skin feels warm where the catheter goes into your body.  You have a fever.  You have pain in your: ? Belly (abdomen). ? Legs. ? Lower back. ? Bladder.  You see blood in the catheter.  Your pee is pink or red.  You feel sick to your stomach (nauseous).  You throw up (vomit).  You have chills.  Your pee is not draining into the bag.  Your catheter gets pulled out. Summary  An indwelling urinary catheter is a thin tube that is placed into the bladder to help drain pee (urine) out of the body.  The catheter is placed into the part of the body that drains pee from the bladder (urethra).  Taking good care of your catheter will keep it working properly and help prevent problems.  Always wash your hands before and after touching your catheter or  bag.  Never pull on your catheter or try to take it out. This information is not intended to replace advice given to you by your health care provider. Make sure you discuss any questions you have with your health care provider. Document Released: 07/13/2012 Document Revised: 07/10/2018 Document Reviewed: 11/01/2016 Elsevier Patient Education  2020 [employer].

## 2019-01-12 ENCOUNTER — Encounter: Payer: Self-pay | Admitting: Urology

## 2019-01-12 NOTE — Anesthesia Postprocedure Evaluation (Signed)
Anesthesia Post Note  Patient: Maxwell Jones  Procedure(s) Performed: CYSTOSCOPY WITH INSERTION OF UROLIFT (N/A Prostate) CYSTOSCOPY WITH LITHOLAPAXY (N/A Bladder) EXAM UNDER ANESTHESIA (N/A Prostate)  Patient location during evaluation: PACU Anesthesia Type: General Level of consciousness: awake and alert Pain management: pain level controlled Vital Signs Assessment: post-procedure vital signs reviewed and stable Respiratory status: spontaneous breathing, nonlabored ventilation, respiratory function stable and patient connected to nasal cannula oxygen Cardiovascular status: blood pressure returned to baseline and stable Postop Assessment: no apparent nausea or vomiting Anesthetic complications: no     Last Vitals:  Vitals:   01/11/19 0939 01/11/19 1020  BP: (!) 152/64 126/65  Pulse: 76 64  Resp: 16 16  Temp: (!) 36 C   SpO2: 98% 98%    Last Pain:  Vitals:   01/12/19 0810  TempSrc:   PainSc: 3                  Martha Clan

## 2019-01-13 ENCOUNTER — Ambulatory Visit: Payer: PPO | Admitting: Physician Assistant

## 2019-01-13 ENCOUNTER — Ambulatory Visit (INDEPENDENT_AMBULATORY_CARE_PROVIDER_SITE_OTHER): Payer: PPO | Admitting: Physician Assistant

## 2019-01-13 ENCOUNTER — Other Ambulatory Visit: Payer: Self-pay

## 2019-01-13 DIAGNOSIS — N401 Enlarged prostate with lower urinary tract symptoms: Secondary | ICD-10-CM

## 2019-01-13 DIAGNOSIS — N138 Other obstructive and reflux uropathy: Secondary | ICD-10-CM | POA: Diagnosis not present

## 2019-01-13 LAB — BLADDER SCAN AMB NON-IMAGING: Scan Result: 234 mL

## 2019-01-13 NOTE — Progress Notes (Signed)
Catheter Removal (AM)  Patient is present today for a catheter removal.  18ml of water was drained from the balloon. A 20FR foley cath was removed from the bladder, no complications were noted. Patient tolerated well.  Performed by: Debroah Loop, PA-C Follow up/ Additional notes: return this afternoon for PVR to complete voiding trial.  Results for orders placed or performed in visit on 01/13/19  BLADDER SCAN AMB NON-IMAGING  Result Value Ref Range   Scan Result 234 mL   Continuous Intermittent Catheterization (PM)  Due to elevated PVR patient is present today for a teaching of self I & O catheterization. Patient was given detailed verbal instructions of self-catheterization. He did not wish to practice this today and requested verbal instruction alone. Patient was given a sample bag with 4x10Fr single-use catheter supplies to take home.  Instructions were given for patient to cath PRN for acute urinary retention.    Performed by: Debroah Loop, PA-C Additional Notes: Patient return to clinic this afternoon reporting urinary frequency throughout the day.  Original bladder scan volume >300 mL; he was able to empty subsequently upon request.  I instructed him in CIC in case of urinary retention, as I suspect this will be short-lived in light of his recent UroLift procedure.  I counseled him to call the office if he becomes unable to urinate.  Expressed understanding.

## 2019-02-04 DIAGNOSIS — Z0000 Encounter for general adult medical examination without abnormal findings: Secondary | ICD-10-CM | POA: Diagnosis not present

## 2019-02-23 ENCOUNTER — Encounter: Payer: Self-pay | Admitting: Urology

## 2019-02-23 ENCOUNTER — Other Ambulatory Visit: Payer: Self-pay

## 2019-02-23 ENCOUNTER — Ambulatory Visit (INDEPENDENT_AMBULATORY_CARE_PROVIDER_SITE_OTHER): Payer: PPO | Admitting: Urology

## 2019-02-23 VITALS — BP 146/83 | HR 70 | Ht 70.0 in | Wt 190.2 lb

## 2019-02-23 DIAGNOSIS — Z87898 Personal history of other specified conditions: Secondary | ICD-10-CM | POA: Diagnosis not present

## 2019-02-23 DIAGNOSIS — N400 Enlarged prostate without lower urinary tract symptoms: Secondary | ICD-10-CM

## 2019-02-23 DIAGNOSIS — Z87448 Personal history of other diseases of urinary system: Secondary | ICD-10-CM | POA: Diagnosis not present

## 2019-02-23 LAB — BLADDER SCAN AMB NON-IMAGING

## 2019-02-23 NOTE — Progress Notes (Signed)
02/23/2019 11:31 AM   [name] 21-Apr-1945 [id number]  Referring provider: Baxter Hire, MD [place],  [place] [address]  Chief Complaint  Patient presents with  . Routine Post Op    HPI: 73 year old male with a personal history of bladder stone, of BPH with obstructive urinary symptoms now status post cystolitholapaxy and UroLift procedure on 01/11/2019.  He reports today that he is doing extremely well.  He had a rough time for the first 2 weeks and in fact went back into urinary retention.  Ultimately passed a voiding trial.  After these 2 weeks, his urinary symptoms started to subside and he is currently completely asymptomatic.  He is extremely pleased with his voiding symptoms and its significantly improved than prior to procedure.  He denies any dysuria or ongoing gross hematuria.  IPSS as below.  Notably, just prior to this procedure, he did have a markedly elevated PSA in the setting of bladder stone exacerbation of his urinary symptoms.  His PSA rose abruptly to 43.9 from his baseline of in the 2 range.  Rectal exam under anesthesia at the time of procedure was unremarkable and biopsy was deferred given low suspicion for prostate cancer.  Repeat PSA today is pending.   IPSS    Row Name 02/23/19 1200         International Prostate Symptom Score   How often have you had the sensation of not emptying your bladder?  Not at All     How often have you had to urinate less than every two hours?  Not at All     How often have you found you stopped and started again several times when you urinated?  Not at All     How often have you found it difficult to postpone urination?  Not at All     How often have you had a weak urinary stream?  Not at All     How often have you had to strain to start urination?  Not at All     How many times did you typically get up at night to urinate?  None     Total IPSS Score  0       Quality of Life due to urinary symptoms    If you were to spend the rest of your life with your urinary condition just the way it is now how would you feel about that?  Delighted        Score:  1-7 Mild 8-19 Moderate 20-35 Severe   PMH: Past Medical History:  Diagnosis Date  . Bladder calculi   . BPH (benign prostatic hyperplasia)   . History of kidney stones   . Hypogonadism in male   . Kidney stone on left side   . Prostatitis   . Urinary hesitancy     Surgical History: Past Surgical History:  Procedure Laterality Date  . Cystolithalopaxy w/Holmium laser    . CYSTOSCOPY WITH INSERTION OF UROLIFT N/A 01/11/2019   Procedure: CYSTOSCOPY WITH INSERTION OF UROLIFT;  Surgeon: Hollice Espy, MD;  Location: [facility] ORS;  Service: Urology;  Laterality: N/A;  . CYSTOSCOPY WITH LITHOLAPAXY N/A 01/11/2019   Procedure: CYSTOSCOPY WITH LITHOLAPAXY;  Surgeon: Hollice Espy, MD;  Location: [facility] OR[facility];  Service: Urology;  Laterality: N/A;  . EXTRACORPOREAL SHOCK WAVE LITHOTRIPSY    . WISDOM TOOTH EXTRACTION      Home Medications:  Allergies as of 02/23/2019      Reactions  Iodinated Diagnostic Agents Rash   Minocycline Rash      Medication List       Accurate as of February 23, 2019 11:59 PM. If you have any questions, ask your nurse or doctor.        STOP taking these medications   oxybutynin 5 MG tablet Commonly known as: DITROPAN Stopped by: Hollice Espy, MD   oxyCODONE-acetaminophen 5-325 MG tablet Commonly known as: PERCOCET/ROXICET Stopped by: Hollice Espy, MD   tamsulosin 0.4 MG Caps capsule Commonly known as: FLOMAX Stopped by: Hollice Espy, MD     TAKE these medications   acetaminophen 325 MG tablet Commonly known as: TYLENOL Take 650 mg by mouth every 6 (six) hours as needed (for pain.).       Allergies:  Allergies  Allergen Reactions  . Iodinated Diagnostic Agents Rash  . Minocycline Rash    Family History: Family History  Problem Relation Age of Onset  . Nephrolithiasis  Mother   . Kidney cancer Brother   . Liver cancer Brother   . Squamous cell carcinoma Brother   . Prostate cancer Neg Hx   . Bladder Cancer Neg Hx     Social History:  reports that he has never smoked. He has never used smokeless tobacco. He reports current alcohol use. He reports that he does not use drugs.  ROS: UROLOGY Frequent Urination?: No Hard to postpone urination?: No Burning/pain with urination?: No Get up at night to urinate?: No Leakage of urine?: No Urine stream starts and stops?: No Trouble starting stream?: No Do you have to strain to urinate?: No Blood in urine?: No Urinary tract infection?: No Sexually transmitted disease?: No Injury to kidneys or bladder?: No Painful intercourse?: No Weak stream?: No Erection problems?: No Penile pain?: No  Gastrointestinal Nausea?: No Vomiting?: No Indigestion/heartburn?: No Diarrhea?: No Constipation?: No  Constitutional Fever: No Night sweats?: No Weight loss?: No Fatigue?: No  Skin Skin rash/lesions?: No Itching?: No  Eyes Blurred vision?: No Double vision?: No  Ears/Nose/Throat Sore throat?: No Sinus problems?: No  Hematologic/Lymphatic Swollen glands?: No Easy bruising?: No  Cardiovascular Leg swelling?: No Chest pain?: No  Respiratory Cough?: No Shortness of breath?: No  Endocrine Excessive thirst?: No  Musculoskeletal Back pain?: No Joint pain?: No  Neurological Headaches?: No Dizziness?: No  Psychologic Depression?: No Anxiety?: No  Physical Exam: BP (!) 146/83 (BP Location: Left Arm, Patient Position: Sitting, Cuff Size: Normal)   Pulse 70   Ht 5\' 10"  (1.778 m)   Wt 190 lb 3.2 oz (86.3 kg)   BMI 27.29 kg/m   Constitutional:  Alert and oriented, No acute distress. HEENT: [place] AT, moist mucus membranes.  Trachea midline, no masses. Cardiovascular: No clubbing, cyanosis, or edema. Respiratory: Normal respiratory effort, no increased work of breathing. Skin: No rashes,  bruises or suspicious lesions. Neurologic: Grossly intact, no focal deficits, moving all 4 extremities. Psychiatric: Normal mood and affect.  Laboratory Data: Lab Results  Component Value Date   WBC 4.2 01/07/2019   HGB 15.0 01/07/2019   HCT 44.5 01/07/2019   MCV 93.3 01/07/2019   PLT 278 01/07/2019    Lab Results  Component Value Date   CREATININE 0.96 01/07/2019   Lab Results  Component Value Date   TESTOSTERONE 201 (L) 07/05/2015     Pertinent Imaging: Results for orders placed or performed in visit on 02/23/19  PSA  Result Value Ref Range   Prostate Specific Ag, Serum 6.7 (H) 0.0 - 4.0 ng/mL  Bladder  Scan (Post Void Residual) in office  Result Value Ref Range   Scan Result 52mL      Assessment & Plan:    1. History of elevated PSA Personal history of marked PSA elevation in the setting of acute urinary symptoms and bladder stone  Based on his previous PSA stability, suspect this was likely reactive.  PSA was repeated today and is down to 6.7.  Anticipate continued improvement, will recheck in 3 months.  He is agreeable this plan. - PSA  2. History of bladder stone Status post successful cystolitholapaxy with dramatic improvement of symptoms following UroLift to address outlet  Adequate bladder emptying - Bladder Scan (Post Void Residual) in office  3. BPH without obstruction/lower urinary tract symptoms Currently on no BPH medications, symptoms extremely well controlled, very pleased with UroLift procedure  Return in about 1 year (around 02/23/2020) for PSA/ KUB/ IPSS/ PVR.  Hollice Espy, MD  [facility] [address], [place], [place] [address] [phone]

## 2019-02-24 ENCOUNTER — Telehealth: Payer: Self-pay | Admitting: *Deleted

## 2019-02-24 LAB — PSA: Prostate Specific Ag, Serum: 6.7 ng/mL — ABNORMAL HIGH (ref 0.0–4.0)

## 2019-02-24 NOTE — Telephone Encounter (Addendum)
Patient informed-verbalized understanding. Scheduled lab and follow up visit.   ----- Message from Hollice Espy, MD sent at 02/24/2019  8:43 AM EST ----- PSA is going back down in the right direction.  This is awesome news.  Is now 6.7.  Your baseline PSA is in the 2 range however bumped all the way up to 43 just before surgery.  I would like to have you swing by the lab in about 3 months for another recheck.  As long as it continues to trend back towards your baseline we can keep her follow-up at 1 year as scheduled.  Please arrange lab only visit in 3 months.  He should have a follow-up in 1 year with me.

## 2019-04-26 DIAGNOSIS — R202 Paresthesia of skin: Secondary | ICD-10-CM | POA: Diagnosis not present

## 2019-04-26 DIAGNOSIS — L821 Other seborrheic keratosis: Secondary | ICD-10-CM | POA: Diagnosis not present

## 2019-04-26 DIAGNOSIS — D2271 Melanocytic nevi of right lower limb, including hip: Secondary | ICD-10-CM | POA: Diagnosis not present

## 2019-04-26 DIAGNOSIS — D225 Melanocytic nevi of trunk: Secondary | ICD-10-CM | POA: Diagnosis not present

## 2019-04-26 DIAGNOSIS — X32XXXA Exposure to sunlight, initial encounter: Secondary | ICD-10-CM | POA: Diagnosis not present

## 2019-04-26 DIAGNOSIS — D2261 Melanocytic nevi of right upper limb, including shoulder: Secondary | ICD-10-CM | POA: Diagnosis not present

## 2019-04-26 DIAGNOSIS — D2262 Melanocytic nevi of left upper limb, including shoulder: Secondary | ICD-10-CM | POA: Diagnosis not present

## 2019-04-26 DIAGNOSIS — D2272 Melanocytic nevi of left lower limb, including hip: Secondary | ICD-10-CM | POA: Diagnosis not present

## 2019-04-26 DIAGNOSIS — B353 Tinea pedis: Secondary | ICD-10-CM | POA: Diagnosis not present

## 2019-04-26 DIAGNOSIS — L570 Actinic keratosis: Secondary | ICD-10-CM | POA: Diagnosis not present

## 2019-05-13 ENCOUNTER — Ambulatory Visit: Payer: PPO | Attending: Internal Medicine

## 2019-05-13 DIAGNOSIS — Z23 Encounter for immunization: Secondary | ICD-10-CM | POA: Insufficient documentation

## 2019-05-13 NOTE — Progress Notes (Signed)
   Covid-19 Vaccination Clinic  Name:  Maxwell Jones    MRN: [id number] DOB: 1946/01/26  05/13/2019  Mr. Maxwell Jones was observed post Covid-19 immunization for 15 minutes without incidence. He was provided with Vaccine Information Sheet and instruction to access the V-Safe system.   Mr. Maxwell Jones was instructed to call 911 with any severe reactions post vaccine: [name] Kitchen Difficulty breathing  . Swelling of your face and throat  . A fast heartbeat  . A bad rash all over your body  . Dizziness and weakness    Immunizations Administered    Name Date Dose VIS Date Route   Pfizer COVID-19 Vaccine 05/13/2019  2:52 PM 0.3 mL 03/12/2019 Intramuscular   Manufacturer: [place]   Lot: [id number]   [place]: [id number]

## 2019-05-27 ENCOUNTER — Other Ambulatory Visit: Payer: Self-pay

## 2019-05-27 DIAGNOSIS — Z87898 Personal history of other specified conditions: Secondary | ICD-10-CM

## 2019-05-28 ENCOUNTER — Other Ambulatory Visit: Payer: PPO

## 2019-05-28 ENCOUNTER — Other Ambulatory Visit: Payer: Self-pay

## 2019-05-28 DIAGNOSIS — Z87898 Personal history of other specified conditions: Secondary | ICD-10-CM | POA: Diagnosis not present

## 2019-05-29 LAB — PSA: Prostate Specific Ag, Serum: 5.1 ng/mL — ABNORMAL HIGH (ref 0.0–4.0)

## 2019-06-05 ENCOUNTER — Ambulatory Visit: Payer: PPO | Attending: Internal Medicine

## 2019-06-05 DIAGNOSIS — Z23 Encounter for immunization: Secondary | ICD-10-CM | POA: Insufficient documentation

## 2019-06-05 NOTE — Progress Notes (Signed)
   Covid-19 Vaccination Clinic  Name:  Maxwell Jones    MRN: [id number] DOB: 01-Mar-1946  06/05/2019  [name] was observed post Covid-19 immunization for 15 minutes without incident. He was provided with Vaccine Information Sheet and instruction to access the V-Safe system.   Mr. Roll was instructed to call 911 with any severe reactions post vaccine: [name] Kitchen Difficulty breathing  . Swelling of face and throat  . A fast heartbeat  . A bad rash all over body  . Dizziness and weakness   Immunizations Administered    Name Date Dose VIS Date Route   Pfizer COVID-19 Vaccine 06/05/2019 12:57 PM 0.3 mL 03/12/2019 Intramuscular   Manufacturer: [place]   Lot: [id number]   [place]: [id number]

## 2019-06-09 ENCOUNTER — Telehealth: Payer: Self-pay | Admitting: *Deleted

## 2019-06-09 NOTE — Telephone Encounter (Addendum)
Left VM with details, asked to return call with any questions.   ----- Message from Hollice Espy, MD sent at 06/09/2019  7:58 AM EST ----- PSA continues to trend in right direction.  We will plan on rechecking at your next follow up.    Hollice Espy, MD

## 2019-07-19 DIAGNOSIS — H35342 Macular cyst, hole, or pseudohole, left eye: Secondary | ICD-10-CM | POA: Diagnosis not present

## 2019-07-19 DIAGNOSIS — H5203 Hypermetropia, bilateral: Secondary | ICD-10-CM | POA: Diagnosis not present

## 2019-07-19 DIAGNOSIS — H2513 Age-related nuclear cataract, bilateral: Secondary | ICD-10-CM | POA: Diagnosis not present

## 2019-07-19 DIAGNOSIS — H524 Presbyopia: Secondary | ICD-10-CM | POA: Diagnosis not present

## 2019-07-19 DIAGNOSIS — H52223 Regular astigmatism, bilateral: Secondary | ICD-10-CM | POA: Diagnosis not present

## 2019-08-11 ENCOUNTER — Other Ambulatory Visit: Payer: Self-pay | Admitting: *Deleted

## 2019-08-11 ENCOUNTER — Ambulatory Visit
Admission: RE | Admit: 2019-08-11 | Discharge: 2019-08-11 | Disposition: A | Discharge status: Home / Self Care | Payer: PPO | Source: Ambulatory Visit | Attending: Urology | Admitting: Urology

## 2019-08-11 ENCOUNTER — Other Ambulatory Visit: Payer: Self-pay

## 2019-08-11 ENCOUNTER — Encounter: Payer: Self-pay | Admitting: Urology

## 2019-08-11 ENCOUNTER — Ambulatory Visit: Payer: PPO | Admitting: Urology

## 2019-08-11 VITALS — BP 148/67 | HR 80 | Ht 68.0 in | Wt 188.0 lb

## 2019-08-11 DIAGNOSIS — N138 Other obstructive and reflux uropathy: Secondary | ICD-10-CM

## 2019-08-11 DIAGNOSIS — Z87442 Personal history of urinary calculi: Secondary | ICD-10-CM | POA: Diagnosis not present

## 2019-08-11 DIAGNOSIS — Z87898 Personal history of other specified conditions: Secondary | ICD-10-CM

## 2019-08-11 DIAGNOSIS — N200 Calculus of kidney: Secondary | ICD-10-CM

## 2019-08-11 DIAGNOSIS — N401 Enlarged prostate with lower urinary tract symptoms: Secondary | ICD-10-CM

## 2019-08-11 DIAGNOSIS — R109 Unspecified abdominal pain: Secondary | ICD-10-CM

## 2019-08-11 DIAGNOSIS — Z87448 Personal history of other diseases of urinary system: Secondary | ICD-10-CM | POA: Diagnosis not present

## 2019-08-11 NOTE — Progress Notes (Signed)
08/11/2019 10:51 PM   Maxwell Jones 03-Feb-1946 [id number]  Referring provider: Baxter Hire, MD [place],  [place] [address]  Chief Complaint  Patient presents with  . Nephrolithiasis    follow up     HPI: 74 year old male with a personal history of bladder stone, nephrolithiasis, BPH with obstructive urinary symptoms now status post cystolitholapaxy and UroLift procedure on 01/11/2019 who presents today for an episode of flank pain.  He states about 5 days ago, he experienced intense pain that started in the left lower back region and radiated to the left buttocks and left inguinal region.  The pain was consistent for 6 hours.  He took Tylenol and two oxycodone/APAP 5/325 without relief.  He was also experiencing nausea, chills and sweats.  He was helping his wife in the shower prior to the onset of the pain.  He went to sleep and when he woke up the pain had abated.  He has not experienced pain that intense since that day.  He has had just a few tinges.  Patient denies any modifying or aggravating factors.  Patient denies any gross hematuria, dysuria or suprapubic/flank pain.  Patient denies any fevers, chills, nausea or vomiting.   UA is benign.  KUB 6 mm stone in the left lower pole.    He states his urine flow is great.  The UroLift was the best thing he has ever done.  PMH: Past Medical History:  Diagnosis Date  . Bladder calculi   . BPH (benign prostatic hyperplasia)   . History of kidney stones   . Hypogonadism in male   . Kidney stone on left side   . Prostatitis   . Urinary hesitancy     Surgical History: Past Surgical History:  Procedure Laterality Date  . Cystolithalopaxy w/Holmium laser    . CYSTOSCOPY WITH INSERTION OF UROLIFT N/A 01/11/2019   Procedure: CYSTOSCOPY WITH INSERTION OF UROLIFT;  Surgeon: Hollice Espy, MD;  Location: [facility] ORS;  Service: Urology;  Laterality: N/A;  . CYSTOSCOPY WITH LITHOLAPAXY N/A 01/11/2019   Procedure:  CYSTOSCOPY WITH LITHOLAPAXY;  Surgeon: Hollice Espy, MD;  Location: [facility] ORS;  Service: Urology;  Laterality: N/A;  . EXTRACORPOREAL SHOCK WAVE LITHOTRIPSY    . WISDOM TOOTH EXTRACTION      Home Medications:  Allergies as of 08/11/2019      Reactions   Iodinated Diagnostic Agents Rash   Minocycline Rash      Medication List       Accurate as of Aug 11, 2019 11:59 PM. If you have any questions, ask your nurse or doctor.        acetaminophen 325 MG tablet Commonly known as: TYLENOL Take 650 mg by mouth every 6 (six) hours as needed (for pain.).       Allergies:  Allergies  Allergen Reactions  . Iodinated Diagnostic Agents Rash  . Minocycline Rash    Family History: Family History  Problem Relation Age of Onset  . Nephrolithiasis Mother   . Kidney cancer Brother   . Liver cancer Brother   . Squamous cell carcinoma Brother   . Prostate cancer Neg Hx   . Bladder Cancer Neg Hx     Social History:  reports that he has never smoked. He has never used smokeless tobacco. He reports current alcohol use. He reports that he does not use drugs.  ROS: For pertinent review of systems please refer to history of present illness  Physical Exam: BP [name] Kitchen)  148/67   Pulse 80   Ht 5\' 8"  (1.727 m)   Wt 188 lb (85.3 kg)   BMI 28.59 kg/m   Constitutional:  Well nourished. Alert and oriented, No acute distress. HEENT:  AT, mask in place.  Trachea midline. Cardiovascular: No clubbing, cyanosis, or edema. Respiratory: Normal respiratory effort, no increased work of breathing. GI: Abdomen is soft, non tender, non distended, no abdominal masses. No hernias appreciated.   GU: No CVA tenderness.  No bladder fullness or masses.   Neurologic: Grossly intact, no focal deficits, moving all 4 extremities. Psychiatric: Normal mood and affect.  Laboratory Data: Lab Results  Component Value Date   WBC 4.2 01/07/2019   HGB 15.0 01/07/2019   HCT 44.5 01/07/2019   MCV 93.3 01/07/2019    PLT 278 01/07/2019    Lab Results  Component Value Date   CREATININE 0.96 01/07/2019   Lab Results  Component Value Date   TESTOSTERONE 201 (L) 07/05/2015    Results for orders placed or performed in visit on 08/11/19  CULTURE, URINE COMPREHENSIVE   Specimen: Urine   UR  Result Value Ref Range   Urine Culture, Comprehensive Final report    Organism ID, Bacteria Comment   Microscopic Examination   URINE  Result Value Ref Range   WBC, UA 0-5 0 - 5 /hpf   RBC 0-2 0 - 2 /hpf   Epithelial Cells (non renal) None seen 0 - 10 /hpf   Bacteria, UA None seen None seen/Few  Urinalysis, Complete  Result Value Ref Range   Specific Gravity, UA >1.030 (H) 1.005 - 1.030   pH, UA 5.5 5.0 - 7.5   Color, UA Yellow Yellow   Appearance Ur Clear Clear   Leukocytes,UA Negative Negative   Protein,UA Negative Negative/Trace   Glucose, UA Negative Negative   Ketones, UA Negative Negative   RBC, UA Negative Negative   Bilirubin, UA Negative Negative   Urobilinogen, Ur 0.2 0.2 - 1.0 mg/dL   Nitrite, UA Negative Negative   Microscopic Examination See below:    I have reviewed the labs.  Pertinent Imaging CLINICAL DATA:  LEFT side kidney stone since last July, no pain currently  EXAM: ABDOMEN - 1 VIEW  COMPARISON:  10/20/2018  FINDINGS: 6 x 4 mm calculus projects over inferior pole of LEFT kidney unchanged.  Ovoid radiopacity with central lucencies projects LEFT paraspinal at L3, question button/clothing artifact.  No additional urinary tract calcifications.  Urolift sutures and phleboliths noted in pelvis.  Bowel gas pattern normal.  Bones demineralized.  IMPRESSION: 6 x 4 mm LEFT renal calculus unchanged.   Electronically Signed   By: Lavonia Dana M.D.   On: 08/12/2019 08:35 I have independently reviewed the films.  See HPI.  Assessment & Plan:    1. Left flank pain - Stable left renal stone on KUB - Offered CT renal stone for further evaluation, he  deferred - Offered return appointment next week for follow up KUB to see if we can capture images of a migrating stone, he deferred - Patient is advised that if they should start to experience pain that is not able to be controlled with pain medication, intractable nausea and/or vomiting and/or fevers greater than 103 or shaking chills to contact the office immediately or seek treatment in the emergency department for emergent intervention.    2. History of elevated PSA - PSA trending down - Has a follow up with Dr. Erlene Quan in 01/2020  3. History of bladder stone  Status post successful cystolitholapaxy with dramatic improvement of symptoms following UroLift to address outlet  4. BPH without obstruction/lower urinary tract symptoms Currently on no BPH medications, symptoms extremely well controlled, remains very pleased with UroLift procedure  Return for Keep follow up with Dr. Erlene Quan in 01/2020 .  Zara Council, PA-C  [facility] [address] [place], [place] [address] [phone]

## 2019-08-12 LAB — URINALYSIS, COMPLETE
Bilirubin, UA: NEGATIVE
Glucose, UA: NEGATIVE
Ketones, UA: NEGATIVE
Leukocytes,UA: NEGATIVE
Nitrite, UA: NEGATIVE
Protein,UA: NEGATIVE
RBC, UA: NEGATIVE
Specific Gravity, UA: >1.03 — ABNORMAL HIGH (ref 1.005–1.030)
Urobilinogen, Ur: 0.2 mg/dL (ref 0.2–1.0)
pH, UA: 5.5 (ref 5.0–7.5)

## 2019-08-12 LAB — MICROSCOPIC EXAMINATION
Bacteria, UA: NONE SEEN
Epithelial Cells (non renal): NONE SEEN /hpf (ref 0–10)

## 2019-08-14 LAB — CULTURE, URINE COMPREHENSIVE

## 2019-08-16 ENCOUNTER — Telehealth: Payer: Self-pay | Admitting: Family Medicine

## 2019-08-16 NOTE — Telephone Encounter (Signed)
Patient notified of normal result and voiced understanding.

## 2019-08-16 NOTE — Telephone Encounter (Signed)
-----   Message from Nori Riis, PA-C sent at 08/16/2019  8:14 AM EDT ----- Please let Mr. Wheatley know that his urine culture was negative.

## 2019-08-31 ENCOUNTER — Telehealth: Payer: Self-pay | Admitting: Family Medicine

## 2019-08-31 ENCOUNTER — Other Ambulatory Visit: Payer: Self-pay | Admitting: Family Medicine

## 2019-08-31 DIAGNOSIS — N200 Calculus of kidney: Secondary | ICD-10-CM

## 2019-08-31 NOTE — Telephone Encounter (Signed)
-----   Message from Nori Riis, PA-C sent at 08/31/2019 11:22 AM EDT ----- Please let Maxwell Jones know that I compared his x-ray from 08/12/2019 to his previous CT scan.  There were two stones in his left kidney on the CT scan, but he only had one in his kidney on the xray.  It is possible that the other one is in his ureter.  I do recommend to have a repeat xray.

## 2019-08-31 NOTE — Telephone Encounter (Signed)
Patient notified and voiced understanding.

## 2019-09-02 ENCOUNTER — Ambulatory Visit
Admission: RE | Admit: 2019-09-02 | Discharge: 2019-09-02 | Disposition: A | Discharge status: Home / Self Care | Payer: PPO | Attending: Urology | Admitting: Urology

## 2019-09-02 ENCOUNTER — Other Ambulatory Visit: Payer: Self-pay

## 2019-09-02 ENCOUNTER — Ambulatory Visit
Admission: RE | Admit: 2019-09-02 | Discharge: 2019-09-02 | Disposition: A | Discharge status: Home / Self Care | Payer: PPO | Source: Ambulatory Visit | Attending: Urology | Admitting: Urology

## 2019-09-02 DIAGNOSIS — N200 Calculus of kidney: Secondary | ICD-10-CM | POA: Insufficient documentation

## 2019-09-03 ENCOUNTER — Telehealth: Payer: Self-pay | Admitting: Family Medicine

## 2019-09-03 NOTE — Telephone Encounter (Signed)
-----   Message from Nori Riis, PA-C sent at 09/03/2019  8:13 AM EDT ----- Please let Mr. Gales know that no stone was seen in the vicinity of the ureters, but the stable lower pole stone on the left remains.

## 2019-09-03 NOTE — Telephone Encounter (Signed)
Patient notified and voiced understanding.

## 2020-02-01 DIAGNOSIS — Z0001 Encounter for general adult medical examination with abnormal findings: Secondary | ICD-10-CM | POA: Diagnosis not present

## 2020-02-08 DIAGNOSIS — Z0001 Encounter for general adult medical examination with abnormal findings: Secondary | ICD-10-CM | POA: Diagnosis not present

## 2020-02-08 DIAGNOSIS — Z0000 Encounter for general adult medical examination without abnormal findings: Secondary | ICD-10-CM | POA: Diagnosis not present

## 2020-02-29 ENCOUNTER — Encounter: Payer: Self-pay | Admitting: Urology

## 2020-02-29 ENCOUNTER — Other Ambulatory Visit: Payer: Self-pay

## 2020-02-29 ENCOUNTER — Ambulatory Visit: Payer: PPO | Admitting: Urology

## 2020-02-29 VITALS — BP 131/68 | HR 68 | Ht 68.0 in | Wt 192.0 lb

## 2020-02-29 DIAGNOSIS — N401 Enlarged prostate with lower urinary tract symptoms: Secondary | ICD-10-CM

## 2020-02-29 DIAGNOSIS — N200 Calculus of kidney: Secondary | ICD-10-CM | POA: Diagnosis not present

## 2020-02-29 DIAGNOSIS — N138 Other obstructive and reflux uropathy: Secondary | ICD-10-CM | POA: Diagnosis not present

## 2020-02-29 DIAGNOSIS — Z87898 Personal history of other specified conditions: Secondary | ICD-10-CM

## 2020-02-29 LAB — BLADDER SCAN AMB NON-IMAGING

## 2020-02-29 NOTE — Progress Notes (Signed)
02/29/2020 9:49 AM   Maxwell Jones 07-07-1945 [id number]  Referring provider: Baxter Hire, MD [name],  [name] [address]  Chief Complaint  Patient presents with   Elevated PSA    HPI: 74 year old male with a personal history of BPH/elevated PSA returns today for routine annual follow-up.  He has a personal history of BPH/bladder stone status post cystolitholapaxy and UroLift 01/11/2019.  Since procedure, he is doing extremely well.  He is on no BPH medications.  He is very pleased with the results.  IPSS as below.  PVR is minimal.  He also has a personal history of elevated PSA.  At last visit, his PSA was trending back to baseline.  Repeat PSA today is pending.  He also had an acute event in May 2021 for possible stone.  He does have a 5 mm left lower pole stone but imaging revealed no ureteral calculi at the time.  His pain resolved.  He had no further episodes of flank pain since then.   IPSS    Row Name 02/29/20 0900         International Prostate Symptom Score   How often have you had the sensation of not emptying your bladder? Less than 1 in 5     How often have you had to urinate less than every two hours? Less than 1 in 5 times     How often have you found you stopped and started again several times when you urinated? Less than 1 in 5 times     How often have you found it difficult to postpone urination? Less than half the time     How often have you had a weak urinary stream? Less than 1 in 5 times     How often have you had to strain to start urination? Not at All     How many times did you typically get up at night to urinate? 1 Time     Total IPSS Score 7       Quality of Life due to urinary symptoms   If you were to spend the rest of your life with your urinary condition just the way it is now how would you feel about that? Pleased            Score:  1-7 Mild 8-19 Moderate 20-35 Severe  Component     Latest Ref Rng & Units  07/05/2015 01/02/2016 06/27/2016 12/31/2016  Prostate Specific Ag, Serum     0.0 - 4.0 ng/mL 3.5 2.7 2.2 2.6   Component     Latest Ref Rng & Units 06/30/2017 12/25/2017 12/31/2018 02/23/2019  Prostate Specific Ag, Serum     0.0 - 4.0 ng/mL 2.1 2.7 43.9 (H) 6.7 (H)   Component     Latest Ref Rng & Units 05/28/2019  Prostate Specific Ag, Serum     0.0 - 4.0 ng/mL 5.1 (H)     PMH: Past Medical History:  Diagnosis Date   Bladder calculi    BPH (benign prostatic hyperplasia)    History of kidney stones    Hypogonadism in male    Kidney stone on left side    Prostatitis    Urinary hesitancy     Surgical History: Past Surgical History:  Procedure Laterality Date   Cystolithalopaxy w/Holmium laser     CYSTOSCOPY WITH INSERTION OF UROLIFT N/A 01/11/2019   Procedure: CYSTOSCOPY WITH INSERTION OF UROLIFT;  Surgeon: Hollice Espy, MD;  Location: [facility]  ORS;  Service: Urology;  Laterality: N/A;   CYSTOSCOPY WITH LITHOLAPAXY N/A 01/11/2019   Procedure: CYSTOSCOPY WITH LITHOLAPAXY;  Surgeon: Hollice Espy, MD;  Location: [facility] ORS;  Service: Urology;  Laterality: N/A;   EXTRACORPOREAL SHOCK WAVE LITHOTRIPSY     WISDOM TOOTH EXTRACTION      Home Medications:  Allergies as of 02/29/2020      Reactions   Iodinated Diagnostic Agents Rash   Minocycline Rash      Medication List       Accurate as of February 29, 2020  9:49 AM. If you have any questions, ask your nurse or doctor.        acetaminophen 325 MG tablet Commonly known as: TYLENOL Take 650 mg by mouth every 6 (six) hours as needed (for pain.).       Allergies:  Allergies  Allergen Reactions   Iodinated Diagnostic Agents Rash   Minocycline Rash    Family History: Family History  Problem Relation Age of Onset   Nephrolithiasis Mother    Kidney cancer Brother    Liver cancer Brother    Squamous cell carcinoma Brother    Prostate cancer Neg Hx    Bladder Cancer Neg Hx     Social History:   reports that he has never smoked. He has never used smokeless tobacco. He reports current alcohol use. He reports that he does not use drugs.   Physical Exam: BP 131/68    Pulse 68    Ht 5\' 8"  (1.727 m)    Wt 192 lb (87.1 kg)    BMI 29.19 kg/m   Constitutional:  Alert and oriented, No acute distress. HEENT: [place] AT, moist mucus membranes.  Trachea midline, no masses. Cardiovascular: No clubbing, cyanosis, or edema. Rectal: Normal sphincter tone.  Prostamegaly, 50+ cc prostate, nontender, no nodules. Respiratory: Normal respiratory effort, no increased work of breathing. GI: Abdomen is soft, nontender, nondistended, no abdominal masses   Pertinent Imaging: Results for orders placed or performed in visit on 02/29/20  BLADDER SCAN AMB NON-IMAGING  Result Value Ref Range   Scan Result 76ml     Assessment & Plan:    1. History of elevated PSA PSA today is pending, rectal exam is unremarkable other than for marked known enlargement  We will call him with the PSA results.  If his PSA has trended back to baseline, he may be able to be released to his primary care physician alternative will continue to follow him.  All questions answered. - PSA - PSA; Future  2. BPH with obstruction/lower urinary tract symptoms Status post successful UroLift procedure currently on no meds - BLADDER SCAN AMB NON-IMAGING  3. Kidney stones Left lower pole stone, asymptomatic  Consider KUB if he develops acute urinary symptoms or flank pain   Return in about 1 year (around 02/28/2021) for 1year, IPSS, PVR PSA.  Hollice Espy, MD  [facility] [address] [place], [place] [address] [phone]   I spent 20 total minutes on the day of the encounter including pre-visit review of the medical record, face-to-face time with the patient, and post visit ordering of labs/imaging/tests.

## 2020-03-01 LAB — PSA: Prostate Specific Ag, Serum: 3.6 ng/mL (ref 0.0–4.0)

## 2020-04-25 DIAGNOSIS — D2262 Melanocytic nevi of left upper limb, including shoulder: Secondary | ICD-10-CM | POA: Diagnosis not present

## 2020-04-25 DIAGNOSIS — D225 Melanocytic nevi of trunk: Secondary | ICD-10-CM | POA: Diagnosis not present

## 2020-04-25 DIAGNOSIS — L821 Other seborrheic keratosis: Secondary | ICD-10-CM | POA: Diagnosis not present

## 2020-04-25 DIAGNOSIS — D2261 Melanocytic nevi of right upper limb, including shoulder: Secondary | ICD-10-CM | POA: Diagnosis not present

## 2020-04-25 DIAGNOSIS — X32XXXA Exposure to sunlight, initial encounter: Secondary | ICD-10-CM | POA: Diagnosis not present

## 2020-04-25 DIAGNOSIS — D2271 Melanocytic nevi of right lower limb, including hip: Secondary | ICD-10-CM | POA: Diagnosis not present

## 2020-04-25 DIAGNOSIS — D2272 Melanocytic nevi of left lower limb, including hip: Secondary | ICD-10-CM | POA: Diagnosis not present

## 2020-04-25 DIAGNOSIS — L570 Actinic keratosis: Secondary | ICD-10-CM | POA: Diagnosis not present

## 2020-05-02 DIAGNOSIS — H52223 Regular astigmatism, bilateral: Secondary | ICD-10-CM | POA: Diagnosis not present

## 2020-05-02 DIAGNOSIS — H524 Presbyopia: Secondary | ICD-10-CM | POA: Diagnosis not present

## 2020-05-02 DIAGNOSIS — H2513 Age-related nuclear cataract, bilateral: Secondary | ICD-10-CM | POA: Diagnosis not present

## 2020-05-02 DIAGNOSIS — H30012 Focal chorioretinal inflammation, juxtapapillary, left eye: Secondary | ICD-10-CM | POA: Diagnosis not present

## 2020-05-02 DIAGNOSIS — H5203 Hypermetropia, bilateral: Secondary | ICD-10-CM | POA: Diagnosis not present

## 2020-07-31 DIAGNOSIS — H30012 Focal chorioretinal inflammation, juxtapapillary, left eye: Secondary | ICD-10-CM | POA: Diagnosis not present

## 2020-08-08 IMAGING — CT CT RENAL STONE PROTOCOL
2 of 4 series · 16 of 46 positions shown, 18 images · non-contrast
Comparison: CT the abdomen and pelvis 03/14/2011.

CLINICAL DATA: 72-year-old male with history of left flank pain
with nausea since [REDACTED]. History of nephrolithiasis.

EXAM:
CT ABDOMEN AND PELVIS WITHOUT CONTRAST
TECHNIQUE: Multidetector CT imaging of the abdomen and pelvis was performed
following the standard protocol without IV contrast.

[Series 2: stone full standard · axial · 0.74mm/px · z∈[-591,-191]mm · 13 of 88 slices shown, 15 images]
[im 4/88  soft-tissue]
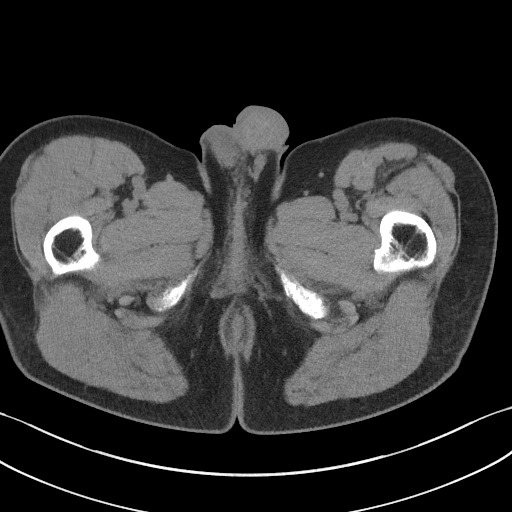
[im 4/88  bone]
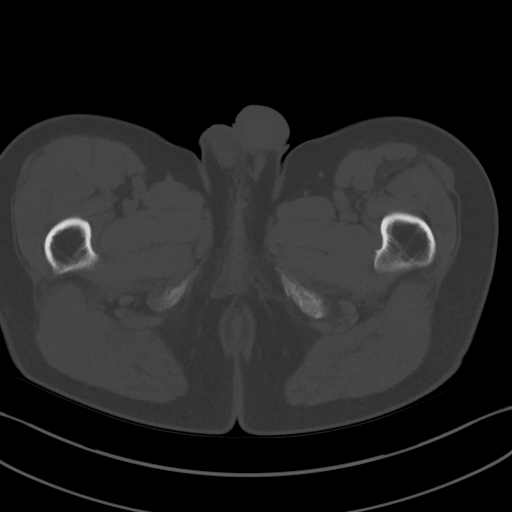
[im 11/88  soft-tissue]
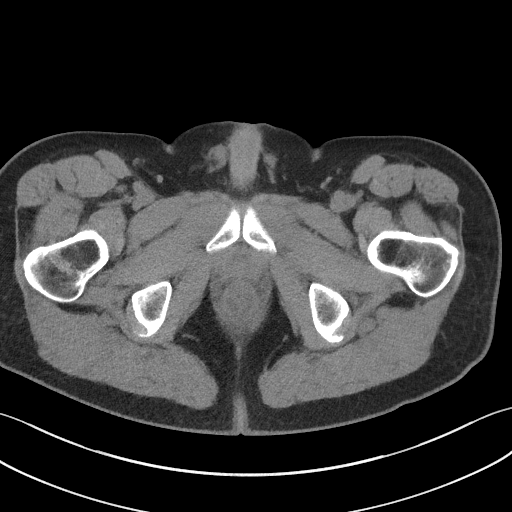
[im 18/88  soft-tissue]
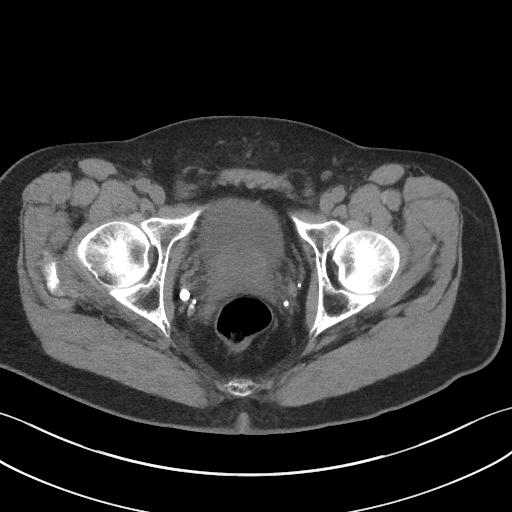
[im 25/88  soft-tissue]
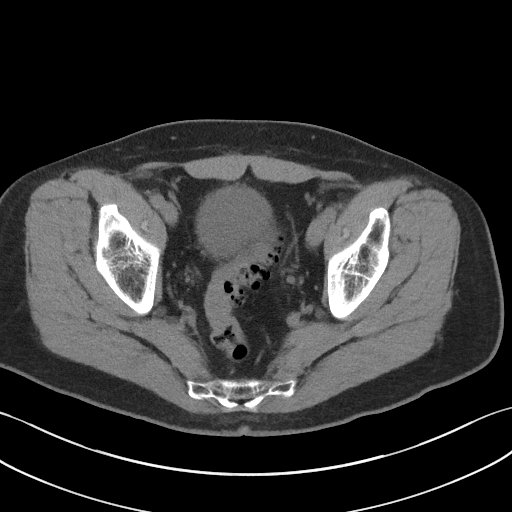
[im 32/88  soft-tissue]
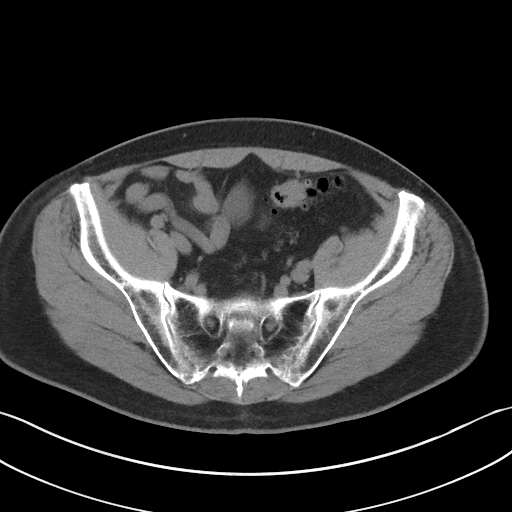
[im 39/88  soft-tissue]
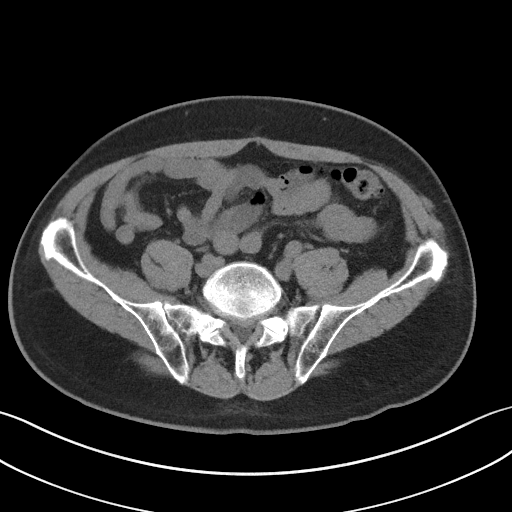
[im 46/88  soft-tissue]
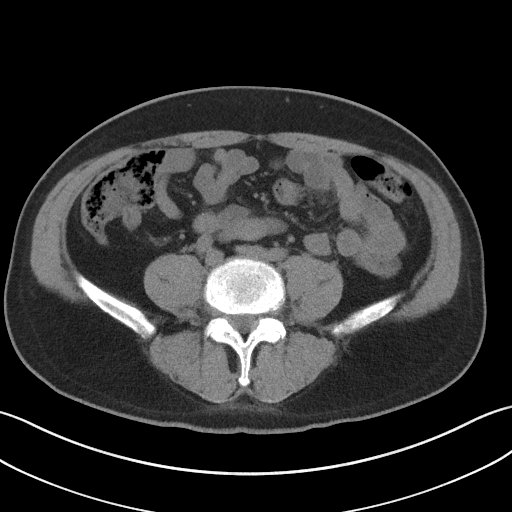
[im 49/88  soft-tissue]
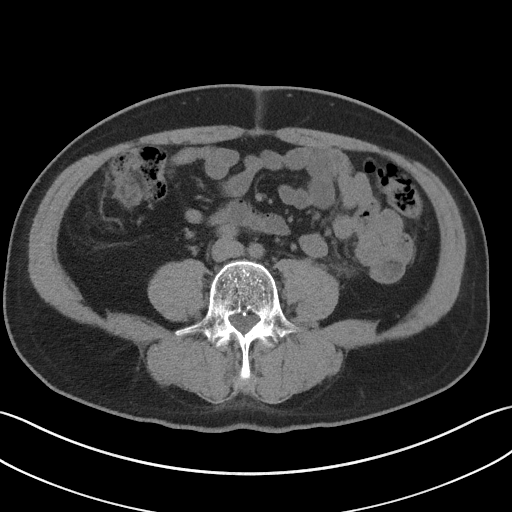
[im 56/88  soft-tissue]
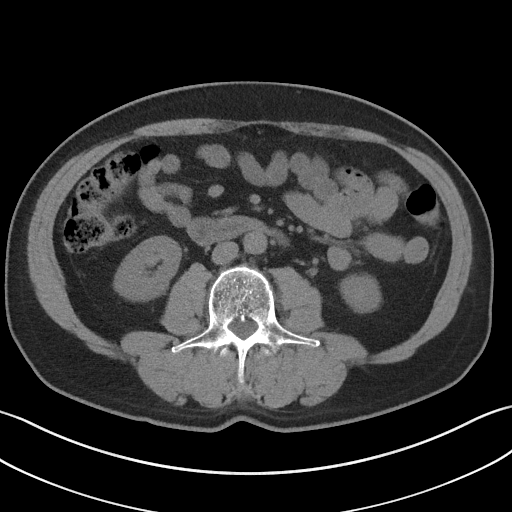
[im 56/88  bone]
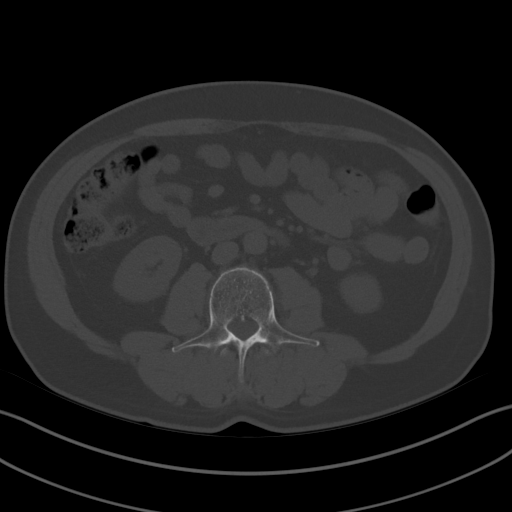
[im 63/88  soft-tissue]
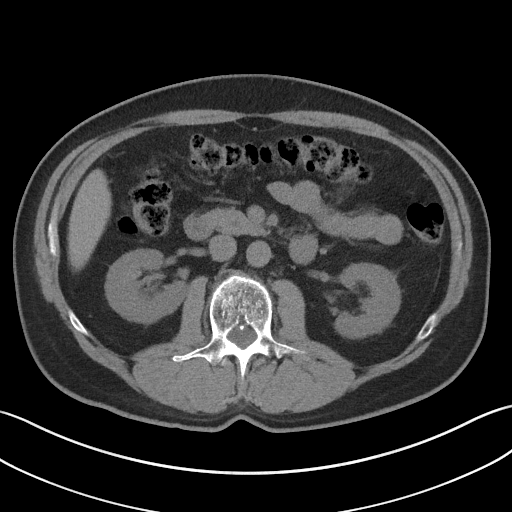
[im 70/88  soft-tissue]
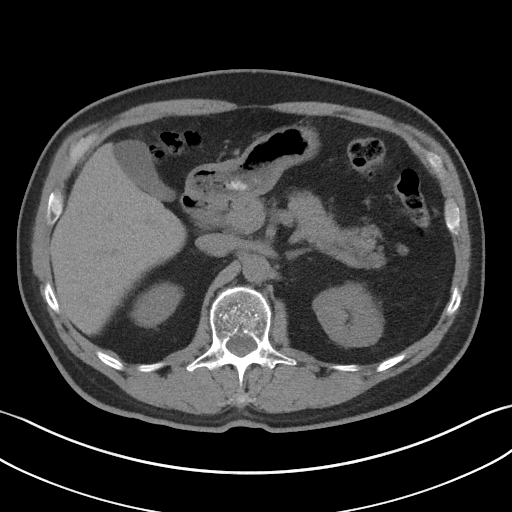
[im 77/88  soft-tissue]
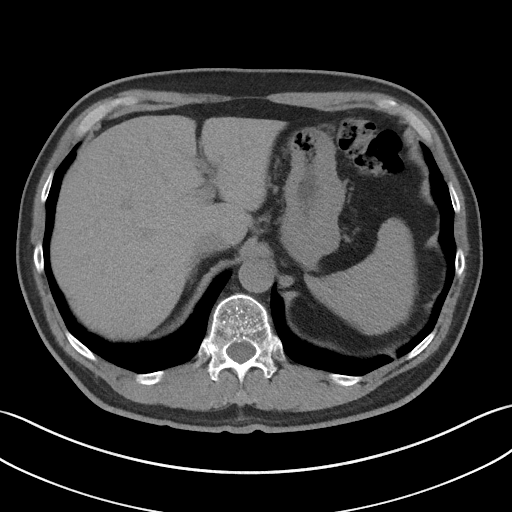
[im 84/88  soft-tissue]
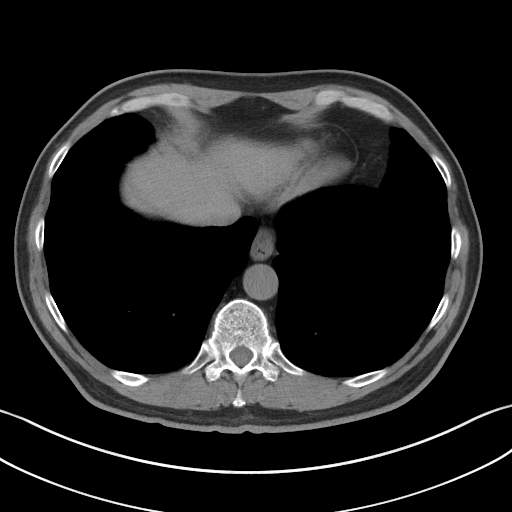

[Series 5: coronal · coronal · 0.78mm/px · 3 of 128 slices shown]
[im 43/128  soft-tissue]
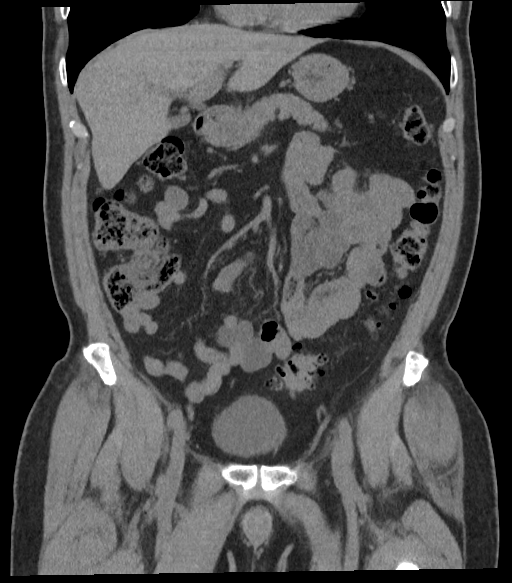
[im 57/128  soft-tissue]
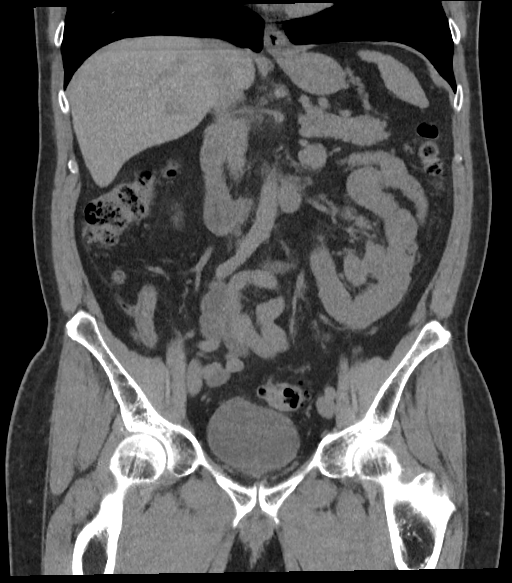
[im 71/128  soft-tissue]
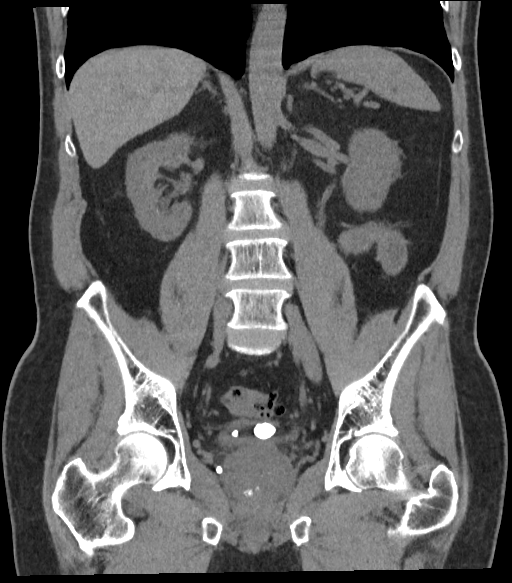

[16 of 46 positions shown; findings below may reference images not displayed]

FINDINGS: Lower chest: Unremarkable.

Hepatobiliary: No definite suspicious cystic or solid hepatic
lesions are confidently identified on today's noncontrast CT
examination. Unenhanced appearance of the gallbladder is normal.

Pancreas: No definite pancreatic mass or peripancreatic fluid
collections or inflammatory changes are noted on today's
non-contrast CT examination.

Spleen: Unremarkable.

Adrenals/Urinary Tract: Nonobstructive calculi are noted within the
left renal collecting system, largest of which measures 6 mm in the
lower pole collecting system. No additional calculi are noted within
the right renal collecting system, or along the course of either
ureter. No hydroureteronephrosis. Unenhanced appearance of the
kidneys is normal. Two calculi are present lying dependently in the
urinary bladder, largest of which measures 1.9 cm. Unenhanced
appearance of the urinary bladder is otherwise unremarkable.
Bilateral adrenal glands are normal in appearance.

Stomach/Bowel: Unenhanced appearance of the stomach is normal. No
pathologic dilatation of small bowel or colon. Numerous colonic
diverticulae are noted, particularly in the sigmoid colon, without
surrounding inflammatory changes to suggest an acute diverticulitis
at this time. Normal appendix.

Vascular/Lymphatic: Atherosclerotic calcifications in the pelvic
vasculature. No lymphadenopathy noted in the abdomen or pelvis.

Reproductive: Prostate gland and seminal vesicles are unremarkable
in appearance.

Other: No significant volume of ascites.  No pneumoperitoneum.

Musculoskeletal: There are no aggressive appearing lytic or blastic
lesions noted in the visualized portions of the skeleton.
IMPRESSION: 1. Nonobstructive calculi are noted within the collecting system of
left kidney measuring up to 6 mm in the lower pole collecting
system. In addition, there are 2 bladder calculi, largest of which
measures 1.9 cm in diameter.
2. At this time, there are no ureteral stones or findings to suggest
urinary tract obstruction.
3. Colonic diverticulosis without evidence of acute diverticulitis
at this time.
4. Additional incidental findings, as above.

## 2021-01-12 DIAGNOSIS — M5417 Radiculopathy, lumbosacral region: Secondary | ICD-10-CM | POA: Diagnosis not present

## 2021-01-12 DIAGNOSIS — M9903 Segmental and somatic dysfunction of lumbar region: Secondary | ICD-10-CM | POA: Diagnosis not present

## 2021-01-12 DIAGNOSIS — M4306 Spondylolysis, lumbar region: Secondary | ICD-10-CM | POA: Diagnosis not present

## 2021-01-12 DIAGNOSIS — M9905 Segmental and somatic dysfunction of pelvic region: Secondary | ICD-10-CM | POA: Diagnosis not present

## 2021-01-15 DIAGNOSIS — M5417 Radiculopathy, lumbosacral region: Secondary | ICD-10-CM | POA: Diagnosis not present

## 2021-01-15 DIAGNOSIS — M4306 Spondylolysis, lumbar region: Secondary | ICD-10-CM | POA: Diagnosis not present

## 2021-01-15 DIAGNOSIS — M9903 Segmental and somatic dysfunction of lumbar region: Secondary | ICD-10-CM | POA: Diagnosis not present

## 2021-01-15 DIAGNOSIS — M9905 Segmental and somatic dysfunction of pelvic region: Secondary | ICD-10-CM | POA: Diagnosis not present

## 2021-01-17 DIAGNOSIS — M9905 Segmental and somatic dysfunction of pelvic region: Secondary | ICD-10-CM | POA: Diagnosis not present

## 2021-01-17 DIAGNOSIS — M9903 Segmental and somatic dysfunction of lumbar region: Secondary | ICD-10-CM | POA: Diagnosis not present

## 2021-01-17 DIAGNOSIS — M5417 Radiculopathy, lumbosacral region: Secondary | ICD-10-CM | POA: Diagnosis not present

## 2021-01-17 DIAGNOSIS — M4306 Spondylolysis, lumbar region: Secondary | ICD-10-CM | POA: Diagnosis not present

## 2021-01-18 DIAGNOSIS — M9903 Segmental and somatic dysfunction of lumbar region: Secondary | ICD-10-CM | POA: Diagnosis not present

## 2021-01-18 DIAGNOSIS — M9905 Segmental and somatic dysfunction of pelvic region: Secondary | ICD-10-CM | POA: Diagnosis not present

## 2021-01-18 DIAGNOSIS — M4306 Spondylolysis, lumbar region: Secondary | ICD-10-CM | POA: Diagnosis not present

## 2021-01-18 DIAGNOSIS — M5417 Radiculopathy, lumbosacral region: Secondary | ICD-10-CM | POA: Diagnosis not present

## 2021-01-24 DIAGNOSIS — M9905 Segmental and somatic dysfunction of pelvic region: Secondary | ICD-10-CM | POA: Diagnosis not present

## 2021-01-24 DIAGNOSIS — M9903 Segmental and somatic dysfunction of lumbar region: Secondary | ICD-10-CM | POA: Diagnosis not present

## 2021-01-24 DIAGNOSIS — M4306 Spondylolysis, lumbar region: Secondary | ICD-10-CM | POA: Diagnosis not present

## 2021-01-24 DIAGNOSIS — M5417 Radiculopathy, lumbosacral region: Secondary | ICD-10-CM | POA: Diagnosis not present

## 2021-01-25 DIAGNOSIS — M4306 Spondylolysis, lumbar region: Secondary | ICD-10-CM | POA: Diagnosis not present

## 2021-01-25 DIAGNOSIS — M9905 Segmental and somatic dysfunction of pelvic region: Secondary | ICD-10-CM | POA: Diagnosis not present

## 2021-01-25 DIAGNOSIS — M9903 Segmental and somatic dysfunction of lumbar region: Secondary | ICD-10-CM | POA: Diagnosis not present

## 2021-01-25 DIAGNOSIS — M5417 Radiculopathy, lumbosacral region: Secondary | ICD-10-CM | POA: Diagnosis not present

## 2021-01-26 DIAGNOSIS — M9905 Segmental and somatic dysfunction of pelvic region: Secondary | ICD-10-CM | POA: Diagnosis not present

## 2021-01-26 DIAGNOSIS — M9903 Segmental and somatic dysfunction of lumbar region: Secondary | ICD-10-CM | POA: Diagnosis not present

## 2021-01-26 DIAGNOSIS — M5417 Radiculopathy, lumbosacral region: Secondary | ICD-10-CM | POA: Diagnosis not present

## 2021-01-26 DIAGNOSIS — M4306 Spondylolysis, lumbar region: Secondary | ICD-10-CM | POA: Diagnosis not present

## 2021-01-29 DIAGNOSIS — M9903 Segmental and somatic dysfunction of lumbar region: Secondary | ICD-10-CM | POA: Diagnosis not present

## 2021-01-29 DIAGNOSIS — M9905 Segmental and somatic dysfunction of pelvic region: Secondary | ICD-10-CM | POA: Diagnosis not present

## 2021-01-29 DIAGNOSIS — M5417 Radiculopathy, lumbosacral region: Secondary | ICD-10-CM | POA: Diagnosis not present

## 2021-01-29 DIAGNOSIS — M4306 Spondylolysis, lumbar region: Secondary | ICD-10-CM | POA: Diagnosis not present

## 2021-01-31 DIAGNOSIS — M4306 Spondylolysis, lumbar region: Secondary | ICD-10-CM | POA: Diagnosis not present

## 2021-01-31 DIAGNOSIS — M5417 Radiculopathy, lumbosacral region: Secondary | ICD-10-CM | POA: Diagnosis not present

## 2021-01-31 DIAGNOSIS — M9905 Segmental and somatic dysfunction of pelvic region: Secondary | ICD-10-CM | POA: Diagnosis not present

## 2021-01-31 DIAGNOSIS — M9903 Segmental and somatic dysfunction of lumbar region: Secondary | ICD-10-CM | POA: Diagnosis not present

## 2021-02-01 DIAGNOSIS — Z125 Encounter for screening for malignant neoplasm of prostate: Secondary | ICD-10-CM | POA: Diagnosis not present

## 2021-02-01 DIAGNOSIS — M9903 Segmental and somatic dysfunction of lumbar region: Secondary | ICD-10-CM | POA: Diagnosis not present

## 2021-02-01 DIAGNOSIS — Z0001 Encounter for general adult medical examination with abnormal findings: Secondary | ICD-10-CM | POA: Diagnosis not present

## 2021-02-01 DIAGNOSIS — M9905 Segmental and somatic dysfunction of pelvic region: Secondary | ICD-10-CM | POA: Diagnosis not present

## 2021-02-01 DIAGNOSIS — M5417 Radiculopathy, lumbosacral region: Secondary | ICD-10-CM | POA: Diagnosis not present

## 2021-02-01 DIAGNOSIS — M4306 Spondylolysis, lumbar region: Secondary | ICD-10-CM | POA: Diagnosis not present

## 2021-02-05 DIAGNOSIS — M4306 Spondylolysis, lumbar region: Secondary | ICD-10-CM | POA: Diagnosis not present

## 2021-02-05 DIAGNOSIS — M9905 Segmental and somatic dysfunction of pelvic region: Secondary | ICD-10-CM | POA: Diagnosis not present

## 2021-02-05 DIAGNOSIS — M5417 Radiculopathy, lumbosacral region: Secondary | ICD-10-CM | POA: Diagnosis not present

## 2021-02-05 DIAGNOSIS — M9903 Segmental and somatic dysfunction of lumbar region: Secondary | ICD-10-CM | POA: Diagnosis not present

## 2021-02-07 DIAGNOSIS — M4306 Spondylolysis, lumbar region: Secondary | ICD-10-CM | POA: Diagnosis not present

## 2021-02-07 DIAGNOSIS — M9905 Segmental and somatic dysfunction of pelvic region: Secondary | ICD-10-CM | POA: Diagnosis not present

## 2021-02-07 DIAGNOSIS — M9903 Segmental and somatic dysfunction of lumbar region: Secondary | ICD-10-CM | POA: Diagnosis not present

## 2021-02-07 DIAGNOSIS — M5417 Radiculopathy, lumbosacral region: Secondary | ICD-10-CM | POA: Diagnosis not present

## 2021-02-08 DIAGNOSIS — Z0001 Encounter for general adult medical examination with abnormal findings: Secondary | ICD-10-CM | POA: Diagnosis not present

## 2021-02-08 DIAGNOSIS — Z0000 Encounter for general adult medical examination without abnormal findings: Secondary | ICD-10-CM | POA: Diagnosis not present

## 2021-02-14 DIAGNOSIS — M9903 Segmental and somatic dysfunction of lumbar region: Secondary | ICD-10-CM | POA: Diagnosis not present

## 2021-02-14 DIAGNOSIS — M9905 Segmental and somatic dysfunction of pelvic region: Secondary | ICD-10-CM | POA: Diagnosis not present

## 2021-02-14 DIAGNOSIS — M4306 Spondylolysis, lumbar region: Secondary | ICD-10-CM | POA: Diagnosis not present

## 2021-02-14 DIAGNOSIS — M5417 Radiculopathy, lumbosacral region: Secondary | ICD-10-CM | POA: Diagnosis not present

## 2021-02-21 DIAGNOSIS — M4306 Spondylolysis, lumbar region: Secondary | ICD-10-CM | POA: Diagnosis not present

## 2021-02-21 DIAGNOSIS — M9905 Segmental and somatic dysfunction of pelvic region: Secondary | ICD-10-CM | POA: Diagnosis not present

## 2021-02-21 DIAGNOSIS — M5417 Radiculopathy, lumbosacral region: Secondary | ICD-10-CM | POA: Diagnosis not present

## 2021-02-21 DIAGNOSIS — M9903 Segmental and somatic dysfunction of lumbar region: Secondary | ICD-10-CM | POA: Diagnosis not present

## 2021-02-26 ENCOUNTER — Other Ambulatory Visit: Payer: Self-pay

## 2021-02-26 ENCOUNTER — Other Ambulatory Visit: Payer: PPO

## 2021-02-26 DIAGNOSIS — Z87898 Personal history of other specified conditions: Secondary | ICD-10-CM

## 2021-02-27 LAB — PSA: Prostate Specific Ag, Serum: 4.3 ng/mL — ABNORMAL HIGH (ref 0.0–4.0)

## 2021-02-27 NOTE — Progress Notes (Signed)
02/28/21 9:21 AM   Maxwell Jones [birth year] [id number]  Referring provider:  Baxter Hire, MD [place],   [id number] Chief Complaint  Patient presents with   Benign Prostatic Hypertrophy    HPI: Maxwell Jones is a 75 y.o.male with a personal history of BPH, kidney stones, and elevated PSA, who presents today for 1 year follow-up with IPSS, PVR, and PSA.   He is s/p urolift and cystolithopaxy on 01/11/2019.   His most recent PSA as of 02/26/2021 was 4.3, which has risen form previous PSA of 3.6 on 02/29/2020. His PSA has been as high as 43.9 in 12/2018.   KUB last year showed a stable left lower pole left renal calculus. No new focal abnormality is noted.  He denies any kidney stone episodes and any urinary symptoms    PSA trend:  Component Prostate Specific Ag, Serum  Latest Ref Rng & Units 0.0 - 4.0 ng/mL  06/27/2016 2.2  12/31/2016 2.6  06/30/2017 2.1  12/25/2017 2.7  12/31/2018 43.9 (H)  02/23/2019 6.7 (H)  05/28/2019 5.1 (H)  02/29/2020 3.6  02/26/2021 4.3 (H)     IPSS     Row Name 02/28/21 0900         International Prostate Symptom Score   How often have you had the sensation of not emptying your bladder? Not at All     How often have you had to urinate less than every two hours? Less than 1 in 5 times     How often have you found you stopped and started again several times when you urinated? Less than 1 in 5 times     How often have you found it difficult to postpone urination? Less than 1 in 5 times     How often have you had a weak urinary stream? Less than 1 in 5 times     How often have you had to strain to start urination? Not at All     How many times did you typically get up at night to urinate? 1 Time     Total IPSS Score 5       Quality of Life due to urinary symptoms   If you were to spend the rest of your life with your urinary condition just the way it is now how would you feel about that? Delighted               Score:  1-7 Mild 8-19 Moderate 20-35 Severe   PMH: Past Medical History:  Diagnosis Date   Bladder calculi    BPH (benign prostatic hyperplasia)    History of kidney stones    Hypogonadism in male    Kidney stone on left side    Prostatitis    Urinary hesitancy     Surgical History: Past Surgical History:  Procedure Laterality Date   Cystolithalopaxy w/Holmium laser     CYSTOSCOPY WITH INSERTION OF UROLIFT N/A 01/11/2019   Procedure: CYSTOSCOPY WITH INSERTION OF UROLIFT;  Surgeon: Hollice Espy, MD;  Location: [facility] ORS;  Service: Urology;  Laterality: N/A;   CYSTOSCOPY WITH LITHOLAPAXY N/A 01/11/2019   Procedure: CYSTOSCOPY WITH LITHOLAPAXY;  Surgeon: Hollice Espy, MD;  Location: [facility] ORS;  Service: Urology;  Laterality: N/A;   EXTRACORPOREAL SHOCK WAVE LITHOTRIPSY     WISDOM TOOTH EXTRACTION      Home Medications:  Allergies as of 02/28/2021       Reactions   Iodinated Diagnostic Agents Rash  Minocycline Rash        Medication List        Accurate as of February 28, 2021  9:21 AM. If you have any questions, ask your nurse or doctor.          acetaminophen 325 MG tablet Commonly known as: TYLENOL Take 650 mg by mouth every 6 (six) hours as needed (for pain.).        Allergies:  Allergies  Allergen Reactions   Iodinated Diagnostic Agents Rash   Minocycline Rash    Family History: Family History  Problem Relation Age of Onset   Nephrolithiasis Mother    Kidney cancer Brother    Liver cancer Brother    Squamous cell carcinoma Brother    Prostate cancer Neg Hx    Bladder Cancer Neg Hx     Social History:  reports that he has never smoked. He has never used smokeless tobacco. He reports current alcohol use. He reports that he does not use drugs.   Physical Exam: BP (!) 158/80   Pulse 77   Ht 5\' 8"  (1.727 m)   Wt 192 lb (87.1 kg)   BMI 29.19 kg/m   Constitutional:  Alert and oriented, No acute distress. HEENT: [place] AT, moist  mucus membranes.  Trachea midline, no masses. Cardiovascular: No clubbing, cyanosis, or edema. Respiratory: Normal respiratory effort, no increased work of breathing. Skin: No rashes, bruises or suspicious lesions. Rectal: Normal sphincter tone,  50  CC prostate, smooth no nodules Neurologic: Grossly intact, no focal deficits, moving all 4 extremities. Psychiatric: Normal mood and affect.  Laboratory Data:  Lab Results  Component Value Date   CREATININE 0.96 01/07/2019    Lab Results  Component Value Date   TESTOSTERONE 201 (L) 07/05/2015     Pertinent Imaging: Results for orders placed or performed in visit on 02/28/21  BLADDER SCAN AMB NON-IMAGING  Result Value Ref Range   Scan Result 38 ml       Assessment & Plan:    BPH with LUTS  - Status post successful UroLift and cystolithopaxy procedure currently on no meds, symptoms are very well controlled  - BLADDER SCAN AMB NON-IMAGING  2. Elevated PSA  - History of fluctuation, his PSA has elevated recently. Rectal exam is unremarkable other than for marked known enlargement, will continue to monitor.  - PSA - PSA; Future  3. History of kidney stones - Left lower pole stone, asymptomatic   - Consider KUB if he develops acute urinary symptoms or flank pain  Return in 1 year with IPSS, PVR, and PSA.   I,Kailey Littlejohn,acting as a Education administrator for Hollice Espy, MD.,have documented all relevant documentation on the behalf of Hollice Espy, MD,as directed by  Hollice Espy, MD while in the presence of Hollice Espy, MD.  I have reviewed the above documentation for accuracy and completeness, and I agree with the above.   Hollice Espy, MD   [facility] [address] [place],  [address] [phone]

## 2021-02-28 ENCOUNTER — Encounter: Payer: Self-pay | Admitting: Urology

## 2021-02-28 ENCOUNTER — Other Ambulatory Visit: Payer: Self-pay

## 2021-02-28 ENCOUNTER — Ambulatory Visit (INDEPENDENT_AMBULATORY_CARE_PROVIDER_SITE_OTHER): Payer: PPO | Admitting: Urology

## 2021-02-28 VITALS — BP 158/80 | HR 77 | Ht 68.0 in | Wt 192.0 lb

## 2021-02-28 DIAGNOSIS — M9903 Segmental and somatic dysfunction of lumbar region: Secondary | ICD-10-CM | POA: Diagnosis not present

## 2021-02-28 DIAGNOSIS — N401 Enlarged prostate with lower urinary tract symptoms: Secondary | ICD-10-CM | POA: Diagnosis not present

## 2021-02-28 DIAGNOSIS — N138 Other obstructive and reflux uropathy: Secondary | ICD-10-CM | POA: Diagnosis not present

## 2021-02-28 DIAGNOSIS — M9905 Segmental and somatic dysfunction of pelvic region: Secondary | ICD-10-CM | POA: Diagnosis not present

## 2021-02-28 DIAGNOSIS — M4306 Spondylolysis, lumbar region: Secondary | ICD-10-CM | POA: Diagnosis not present

## 2021-02-28 DIAGNOSIS — M5417 Radiculopathy, lumbosacral region: Secondary | ICD-10-CM | POA: Diagnosis not present

## 2021-02-28 LAB — BLADDER SCAN AMB NON-IMAGING: Scan Result: 38

## 2021-03-13 DIAGNOSIS — M9905 Segmental and somatic dysfunction of pelvic region: Secondary | ICD-10-CM | POA: Diagnosis not present

## 2021-03-13 DIAGNOSIS — M5417 Radiculopathy, lumbosacral region: Secondary | ICD-10-CM | POA: Diagnosis not present

## 2021-03-13 DIAGNOSIS — M4306 Spondylolysis, lumbar region: Secondary | ICD-10-CM | POA: Diagnosis not present

## 2021-03-13 DIAGNOSIS — M9903 Segmental and somatic dysfunction of lumbar region: Secondary | ICD-10-CM | POA: Diagnosis not present

## 2021-03-28 ENCOUNTER — Other Ambulatory Visit: Payer: Self-pay

## 2021-03-28 ENCOUNTER — Ambulatory Visit
Admission: RE | Admit: 2021-03-28 | Discharge: 2021-03-28 | Disposition: A | Discharge status: Home / Self Care | Payer: PPO | Attending: Physician Assistant | Admitting: Physician Assistant

## 2021-03-28 ENCOUNTER — Ambulatory Visit
Admission: RE | Admit: 2021-03-28 | Discharge: 2021-03-28 | Disposition: A | Discharge status: Home / Self Care | Payer: PPO | Source: Ambulatory Visit | Attending: Physician Assistant | Admitting: Physician Assistant

## 2021-03-28 ENCOUNTER — Ambulatory Visit: Payer: PPO | Admitting: Physician Assistant

## 2021-03-28 VITALS — BP 150/88 | HR 73 | Temp 98.0°F | Ht 68.0 in | Wt 190.0 lb

## 2021-03-28 DIAGNOSIS — I878 Other specified disorders of veins: Secondary | ICD-10-CM | POA: Diagnosis not present

## 2021-03-28 DIAGNOSIS — N138 Other obstructive and reflux uropathy: Secondary | ICD-10-CM

## 2021-03-28 DIAGNOSIS — N200 Calculus of kidney: Secondary | ICD-10-CM | POA: Diagnosis not present

## 2021-03-28 DIAGNOSIS — Z87442 Personal history of urinary calculi: Secondary | ICD-10-CM | POA: Diagnosis not present

## 2021-03-28 DIAGNOSIS — R109 Unspecified abdominal pain: Secondary | ICD-10-CM | POA: Diagnosis not present

## 2021-03-28 DIAGNOSIS — N401 Enlarged prostate with lower urinary tract symptoms: Secondary | ICD-10-CM | POA: Diagnosis not present

## 2021-03-28 LAB — URINALYSIS, COMPLETE
Bilirubin, UA: NEGATIVE
Glucose, UA: NEGATIVE
Ketones, UA: NEGATIVE
Leukocytes,UA: NEGATIVE
Nitrite, UA: NEGATIVE
Protein,UA: NEGATIVE
Specific Gravity, UA: 1.025 (ref 1.005–1.030)
Urobilinogen, Ur: 0.2 mg/dL (ref 0.2–1.0)
pH, UA: 5.5 (ref 5.0–7.5)

## 2021-03-28 LAB — BLADDER SCAN AMB NON-IMAGING

## 2021-03-28 LAB — MICROSCOPIC EXAMINATION
Bacteria, UA: NONE SEEN
RBC, Urine: >30 /hpf — AB (ref 0–2)

## 2021-03-28 MED ORDER — ONDANSETRON 4 MG PO TBDP: 4.0000 mg | ORAL_TABLET | Freq: Three times a day (TID) | ORAL | 0 refills | Status: DC | PRN

## 2021-03-28 MED ORDER — OXYCODONE-ACETAMINOPHEN 5-325 MG PO TABS: 1.0000 | ORAL_TABLET | Freq: Four times a day (QID) | ORAL | 0 refills | Status: AC | PRN

## 2021-03-28 MED ORDER — TAMSULOSIN HCL 0.4 MG PO CAPS: 0.4000 mg | ORAL_CAPSULE | Freq: Every day | ORAL | 0 refills | Status: DC

## 2021-03-28 NOTE — Progress Notes (Signed)
03/28/2021 9:03 AM   Maxwell Jones 22-Jul-1945 [id number]  CC: Chief Complaint  Patient presents with   Benign Prostatic Hypertrophy   HPI: Maxwell Jones is a 75 y.o. male with PMH BPH with bladder stones s/p UroLift and cystolitholapaxy on 01/11/2019 and nephrolithiasis who presents today for evaluation of possible acute stone episode.   Today he reports a 2-week history of intermittent gross hematuria that was accompanied by right flank pain and nausea yesterday.  His symptoms have resolved today.    He underwent KUB today which shows stable left lower pole nephrolithiasis.  There does appear to be interval development of a pelvic calcification that could represent a new 70mm bladder stone.  I also see a possible 5 mm distal right ureteral stone that appears new compared to prior imaging.  In-office UA today positive for 2+ blood; urine microscopy with >30 RBCs/HPF. PVR 22mL.  PMH: Past Medical History:  Diagnosis Date   Bladder calculi    BPH (benign prostatic hyperplasia)    History of kidney stones    Hypogonadism in male    Kidney stone on left side    Prostatitis    Urinary hesitancy     Surgical History: Past Surgical History:  Procedure Laterality Date   Cystolithalopaxy w/Holmium laser     CYSTOSCOPY WITH INSERTION OF UROLIFT N/A 01/11/2019   Procedure: CYSTOSCOPY WITH INSERTION OF UROLIFT;  Surgeon: Hollice Espy, MD;  Location: [facility];  Service: Urology;  Laterality: N/A;   CYSTOSCOPY WITH LITHOLAPAXY N/A 01/11/2019   Procedure: CYSTOSCOPY WITH LITHOLAPAXY;  Surgeon: Hollice Espy, MD;  Location: [facility];  Service: Urology;  Laterality: N/A;   EXTRACORPOREAL SHOCK WAVE LITHOTRIPSY     WISDOM TOOTH EXTRACTION      Home Medications:  Allergies as of 03/28/2021       Reactions   Iodinated Contrast Media Rash   Minocycline Rash        Medication List        Accurate as of March 28, 2021  9:03 AM. If you have any questions, ask your nurse or  doctor.          acetaminophen 325 MG tablet Commonly known as: TYLENOL Take 650 mg by mouth every 6 (six) hours as needed (for pain.).        Allergies:  Allergies  Allergen Reactions   Iodinated Contrast Media Rash   Minocycline Rash    Family History: Family History  Problem Relation Age of Onset   Nephrolithiasis Mother    Kidney cancer Brother    Liver cancer Brother    Squamous cell carcinoma Brother    Prostate cancer Neg Hx    Bladder Cancer Neg Hx     Social History:   reports that he has never smoked. He has never used smokeless tobacco. He reports current alcohol use. He reports that he does not use drugs.  Physical Exam: BP (!) 150/88    Pulse 73    Temp 98 F (36.7 C) (Oral)    Ht 5\' 8"  (1.727 m)    Wt 190 lb (86.2 kg)    BMI 28.89 kg/m   Constitutional:  Alert and oriented, no acute distress, nontoxic appearing HEENT: [place], AT Cardiovascular: No clubbing, cyanosis, or edema Respiratory: Normal respiratory effort, no increased work of breathing Skin: No rashes, bruises or suspicious lesions Neurologic: Grossly intact, no focal deficits, moving all 4 extremities Psychiatric: Normal mood and affect  Laboratory Data: Results for orders placed or  performed in visit on 03/28/21  Microscopic Examination   Urine  Result Value Ref Range   WBC, UA 0-5 0 - 5 /hpf   RBC >30 (A) 0 - 2 /hpf   Epithelial Cells (non renal) 0-10 0 - 10 /hpf   Bacteria, UA None seen None seen/Few  Urinalysis, Complete  Result Value Ref Range   Specific Gravity, UA 1.025 1.005 - 1.030   pH, UA 5.5 5.0 - 7.5   Color, UA Yellow Yellow   Appearance Ur Clear Clear   Leukocytes,UA Negative Negative   Protein,UA Negative Negative/Trace   Glucose, UA Negative Negative   Ketones, UA Negative Negative   RBC, UA 2+ (A) Negative   Bilirubin, UA Negative Negative   Urobilinogen, Ur 0.2 0.2 - 1.0 mg/dL   Nitrite, UA Negative Negative   Microscopic Examination See below:   BLADDER  SCAN AMB NON-IMAGING  Result Value Ref Range   Scan Result 64mL    Pertinent Imaging: KUB, 03/18/2021: CLINICAL DATA:  Kidney stones   EXAM: ABDOMEN - 1 VIEW   COMPARISON:  09/02/2019   FINDINGS: Two adjacent stones are demonstrated projected over the upper pole of the left kidney. No change in size or appearance since the previous study. No new stones are demonstrated. Multiple calcified phleboliths in the pelvis. Seed implants in the prostate gland. Bowel gas pattern is normal with scattered gas and stool throughout the colon. No small or large bowel distention. Degenerative changes in the spine.   IMPRESSION: Two stones projected over the upper pole left kidney without change in size or position since prior study.     Electronically Signed   By: Lucienne Capers M.D.   On: 03/28/2021 20:39  I personally reviewed the images referenced above and note a new possible bladder stone as well as a possible distal right ureteral stone.  Assessment & Plan:   1. Flank pain with history of urolithiasis UA today notable for microscopic hematuria, though his vitals are stable and he is otherwise asymptomatic today.  His reported symptoms are more consistent with a ureteral stone, however there is evidence of a possible new bladder stone on imaging today.  At this point, we recommended proceeding with a CT stone study for definitive characterization of these calcifications.  Patient is in agreement with this plan.  In the interim, I am starting him Flomax and prescribing Zofran and Percocet for symptom management for presumed acute stone episode.  We discussed that if this is a ureteral stone, based on size he is likely to pass it on his own.  If this is a bladder stone, will need to proceed with cystolitholapaxy. - Urinalysis, Complete - CT RENAL STONE STUDY; Future - tamsulosin (FLOMAX) 0.4 MG CAPS capsule; Take 1 capsule (0.4 mg total) by mouth daily.  Dispense: 30 capsule; Refill:  0 - ondansetron (ZOFRAN-ODT) 4 MG disintegrating tablet; Take 1 tablet (4 mg total) by mouth every 8 (eight) hours as needed for nausea or vomiting.  Dispense: 20 tablet; Refill: 0 - oxyCODONE-acetaminophen (PERCOCET/ROXICET) 5-325 MG tablet; Take 1-2 tablets by mouth every 6 (six) hours as needed for up to 5 days for severe pain.  Dispense: 10 tablet; Refill: 0  2. BPH with obstruction/lower urinary tract symptoms Likely etiology of any confirmed bladder stone as above.  He is emptying appropriately today. - BLADDER SCAN AMB NON-IMAGING   Return in about 4 weeks (around 04/25/2021) for Symptom recheck with UA, will call with results.  Debroah Loop, PA-C  [facility] [address], [place], [place] [address] [phone]

## 2021-03-28 NOTE — Patient Instructions (Addendum)
For the next 4 weeks, please do the following: -Take Flomax 0.4mg  daily as prescribed today -Stay well hydrated -Strain your urine to catch any stones that pass -Treat any pain with ibuprofen/tylenol or Percocet as prescribed today -Treat any nausea with Zofran as prescribed today  I will plan to see you back in clinic in 4 weeks to see if you have passed a stone. The imaging department will call you to schedule your CT scan and I will call you when those results are available.  Please call our office immediately (we are open 8a-5p Monday-Friday) or go to the Emergency Department if you develop any of the following: -Fever -Chills -Nausea and/or vomiting uncontrollable with Zofran -Pain uncontrollable with Percocet

## 2021-04-03 ENCOUNTER — Other Ambulatory Visit: Payer: Self-pay

## 2021-04-03 ENCOUNTER — Ambulatory Visit
Admission: RE | Admit: 2021-04-03 | Discharge: 2021-04-03 | Disposition: A | Discharge status: Home / Self Care | Payer: PPO | Source: Ambulatory Visit | Attending: Physician Assistant | Admitting: Physician Assistant

## 2021-04-03 DIAGNOSIS — Z87442 Personal history of urinary calculi: Secondary | ICD-10-CM | POA: Diagnosis not present

## 2021-04-03 DIAGNOSIS — N210 Calculus in bladder: Secondary | ICD-10-CM | POA: Diagnosis not present

## 2021-04-03 DIAGNOSIS — R109 Unspecified abdominal pain: Secondary | ICD-10-CM | POA: Insufficient documentation

## 2021-04-03 DIAGNOSIS — N132 Hydronephrosis with renal and ureteral calculous obstruction: Secondary | ICD-10-CM | POA: Diagnosis not present

## 2021-04-05 ENCOUNTER — Telehealth: Payer: Self-pay

## 2021-04-05 NOTE — Telephone Encounter (Signed)
-----   Message from Nori Riis, PA-C sent at 04/05/2021  8:21 AM EST ----- Mr. Haycraft is scheduled with Sam on January 30, so we will need to reschedule that appointment with me or with Dr. Erlene Quan.

## 2021-04-05 NOTE — Telephone Encounter (Signed)
Pt R/S for 1/31 w/ Larene [facility]. Notified pt's wife (ok per DPR).

## 2021-04-24 DIAGNOSIS — M4306 Spondylolysis, lumbar region: Secondary | ICD-10-CM | POA: Diagnosis not present

## 2021-04-24 DIAGNOSIS — M9903 Segmental and somatic dysfunction of lumbar region: Secondary | ICD-10-CM | POA: Diagnosis not present

## 2021-04-24 DIAGNOSIS — M5417 Radiculopathy, lumbosacral region: Secondary | ICD-10-CM | POA: Diagnosis not present

## 2021-04-24 DIAGNOSIS — M5136 Other intervertebral disc degeneration, lumbar region: Secondary | ICD-10-CM | POA: Diagnosis not present

## 2021-04-25 DIAGNOSIS — D2272 Melanocytic nevi of left lower limb, including hip: Secondary | ICD-10-CM | POA: Diagnosis not present

## 2021-04-25 DIAGNOSIS — L570 Actinic keratosis: Secondary | ICD-10-CM | POA: Diagnosis not present

## 2021-04-25 DIAGNOSIS — D225 Melanocytic nevi of trunk: Secondary | ICD-10-CM | POA: Diagnosis not present

## 2021-04-25 DIAGNOSIS — D2262 Melanocytic nevi of left upper limb, including shoulder: Secondary | ICD-10-CM | POA: Diagnosis not present

## 2021-04-25 DIAGNOSIS — D2271 Melanocytic nevi of right lower limb, including hip: Secondary | ICD-10-CM | POA: Diagnosis not present

## 2021-04-25 DIAGNOSIS — D2261 Melanocytic nevi of right upper limb, including shoulder: Secondary | ICD-10-CM | POA: Diagnosis not present

## 2021-04-25 DIAGNOSIS — L821 Other seborrheic keratosis: Secondary | ICD-10-CM | POA: Diagnosis not present

## 2021-04-30 ENCOUNTER — Ambulatory Visit: Payer: PPO | Admitting: Physician Assistant

## 2021-04-30 NOTE — Progress Notes (Signed)
05/01/2021 10:48 AM   Maxwell Jones 1946/01/01 [id number]  Referring provider: Baxter Hire, MD Maxwell Jones,  Maxwell Jones [id number]  Chief Complaint  Patient presents with   Nephrolithiasis   Urological history: 1. Elevated PSA -PSA Trend  Component     Latest Ref Rng & Units 06/30/2017 12/25/2017 12/31/2018 02/23/2019  Prostate Specific Ag, Serum     0.0 - 4.0 ng/mL 2.1 2.7 43.9 (H) 6.7 (H)   Component     Latest Ref Rng & Units 05/28/2019 02/29/2020 02/26/2021  Prostate Specific Ag, Serum     0.0 - 4.0 ng/mL 5.1 (H) 3.6 4.3 (H)   Prostate biopsy 2016 - negative  2. Nephrolithiasis -left URS 2015 -CT renal stone 04/2021 - 6 mm distal right ureteral stone, 6 mm stone left renal stone  3. Bladder stone -cystolitholapaxy 2020 -CT renal stone 04/2021 - 0.9 cm bladder stone  4. BPH with LU TS -prostate volume on CT 2020 - 5.0 x 5.5 x 4.4 cm  -cysto 2020 - Enlarged prostate trilobar coaptation -s/p UroLift, 4 implants - 2020  HPI: Maxwell Jones is a 76 y.o. male who presents today to review CT scan results.  Patient was last seen in our office on March 28, 2021 with Sam.  At that appointment he was having a 2-week history of intermittent gross hematuria that was accompanied by right-sided flank pain and nausea.  His UA was positive for greater than 30 RBCs.    CT scan performed on April 03, 2021 noted Minimally/partially obstructing 6 mm distal right ureteral stone.  Persistent 6 mm stone in the left interpolar collecting system.  Interval resolution of the previously identified 1.9 cm bladder stone. A 0.9 cm bladder stone remains.  Today, he feels well.  He is not having any further right-sided back pain or nausea.  He also denies any gross hematuria.  He has not passed a fragment as of this visit.  Patient denies any modifying or aggravating factors.  Patient denies any gross hematuria, dysuria or suprapubic/flank pain.  Patient denies any fevers, chills,  nausea or vomiting.       UA negative.   KUB demonstrates the 0.9 cm bladder stone which is stable and the right 6 mm stone is now located within the bladder.  PMH: Past Medical History:  Diagnosis Date   Bladder calculi    BPH (benign prostatic hyperplasia)    History of kidney stones    Hypogonadism in male    Kidney stone on left side    Prostatitis    Urinary hesitancy     Surgical History: Past Surgical History:  Procedure Laterality Date   Cystolithalopaxy w/Holmium laser     CYSTOSCOPY WITH INSERTION OF UROLIFT N/A 01/11/2019   Procedure: CYSTOSCOPY WITH INSERTION OF UROLIFT;  Surgeon: Hollice Espy, MD;  Location: [facility];  Service: Urology;  Laterality: N/A;   CYSTOSCOPY WITH LITHOLAPAXY N/A 01/11/2019   Procedure: CYSTOSCOPY WITH LITHOLAPAXY;  Surgeon: Hollice Espy, MD;  Location: [facility];  Service: Urology;  Laterality: N/A;   EXTRACORPOREAL SHOCK WAVE LITHOTRIPSY     WISDOM TOOTH EXTRACTION      Home Medications:  Allergies as of 05/01/2021       Reactions   Iodinated Contrast Media Rash   Minocycline Rash        Medication List        Accurate as of May 01, 2021 10:48 AM. If you have any questions, ask your  or doctor.  °  °  ° °  ° °acetaminophen 325 MG tablet °Commonly known as: TYLENOL °Take 650 mg by mouth every 6 (six) hours as needed (for pain.). °  °ondansetron 4 MG disintegrating tablet °Commonly known as: ZOFRAN-ODT °Take 1 tablet (4 mg total) by mouth every 8 (eight) hours as needed for nausea or vomiting. °  °tamsulosin 0.4 MG Caps capsule °Commonly known as: FLOMAX °Take 1 capsule (0.4 mg total) by mouth daily. °  ° °  ° ° °Allergies:  °Allergies  °Allergen Reactions  ° Iodinated Contrast Media Rash  ° Minocycline Rash  ° ° °Family History: °Family History  °Problem Relation Age of Onset  ° Nephrolithiasis Mother   ° Kidney cancer Brother   ° Liver cancer Brother   ° Squamous cell carcinoma Brother   ° Prostate cancer Neg Hx   °  Bladder Cancer Neg Hx   ° ° °Social History:  reports that he has never smoked. He has never used smokeless tobacco. He reports current alcohol use. He reports that he does not use drugs. ° °ROS: °Pertinent ROS in HPI ° °Physical Exam: °BP 139/81    Pulse 71    Ht 5' 8" (1.727 m)    Wt 190 lb (86.2 kg)    BMI 28.89 kg/m²   °Constitutional:  Well nourished. Alert and oriented, No acute distress. °HEENT: [place] AT, mask in place.  Trachea midline °Cardiovascular: No clubbing, cyanosis, or edema. °Respiratory: Normal respiratory effort, no increased work of breathing. °Neurologic: Grossly intact, no focal deficits, moving all 4 extremities. °Psychiatric: Normal mood and affect. ° °Laboratory Data: °Results for orders placed or performed in visit on 03/28/21  °Microscopic Examination  ° Urine  °Result Value Ref Range  ° WBC, UA 0-5 0 - 5 /hpf  ° RBC >30 (A) 0 - 2 /hpf  ° Epithelial Cells (non renal) 0-10 0 - 10 /hpf  ° Bacteria, UA None seen None seen/Few  °Urinalysis, Complete  °Result Value Ref Range  ° Specific Gravity, UA 1.025 1.005 - 1.030  ° pH, UA 5.5 5.0 - 7.5  ° Color, UA Yellow Yellow  ° Appearance Ur Clear Clear  ° Leukocytes,UA Negative Negative  ° Protein,UA Negative Negative/Trace  ° Glucose, UA Negative Negative  ° Ketones, UA Negative Negative  ° RBC, UA 2+ (A) Negative  ° Bilirubin, UA Negative Negative  ° Urobilinogen, Ur 0.2 0.2 - 1.0 mg/dL  ° Nitrite, UA Negative Negative  ° Microscopic Examination See below:   °BLADDER SCAN AMB NON-IMAGING  °Result Value Ref Range  ° Scan Result 47mL   °  °Component °    Latest Ref Rng & Units 06/30/2017 12/25/2017 12/31/2018 02/23/2019  °Prostate Specific Ag, Serum °    0.0 - 4.0 ng/mL 2.1 2.7 43.9 (H) 6.7 (H)  ° °Component °    Latest Ref Rng & Units 05/28/2019 02/29/2020 02/26/2021  °Prostate Specific Ag, Serum °    0.0 - 4.0 ng/mL 5.1 (H) 3.6 4.3 (H)  ° °WBC (White Blood Cell Count) 4.1 - 10.2 10ˆ3/uL 4.7   °RBC (Red Blood Cell Count) 4.69 - 6.13 10ˆ6/uL 5.01    °Hemoglobin 14.1 - 18.1 gm/dL 15.8   °Hematocrit 40.0 - 52.0 % 46.9   °MCV (Mean Corpuscular Volume) 80.0 - 100.0 fl 93.6   °MCH (Mean Corpuscular Hemoglobin) 27.0 - 31.2 pg 31.5 High    °MCHC (Mean Corpuscular Hemoglobin Concentration) 32.0 - 36.0 gm/dL 33.7   °Platelet Count 150 - 450   450 103/uL 234   RDW-CV (Red Cell Distribution Width) 11.6 - 14.8 % 12.8   MPV (Mean Platelet Volume) 9.4 - 12.4 fl 10.1   Neutrophils 1.50 - 7.80 103/uL 2.12   Lymphocytes 1.00 - 3.60 103/uL 1.80   Monocytes 0.00 - 1.50 103/uL 0.50   Eosinophils 0.00 - 0.55 103/uL 0.19   Basophils 0.00 - 0.09 103/uL 0.05   Neutrophil % 32.0 - 70.0 % 45.4   Lymphocyte % 10.0 - 50.0 % 38.5   Monocyte % 4.0 - 13.0 % 10.7   Eosinophil % 1.0 - 5.0 % 4.1   Basophil% 0.0 - 2.0 % 1.1   Immature Granulocyte % <=0.7 % 0.2   Immature Granulocyte Count <=0.06 10^3/L 0.01   Resulting Agency  [facility]  Specimen Collected: 02/01/21 07:36 Last Resulted: 02/01/21 08:28  Received From: [facility]  Result Received: 02/14/21 13:30   Cholesterol, Total 100 - 200 mg/dL 158   Triglyceride 35 - 199 mg/dL 80   HDL (High Density Lipoprotein) Cholesterol 29.0 - 71.0 mg/dL 53.0   LDL Calculated 0 - 130 mg/dL 89   VLDL Cholesterol mg/dL 16   Cholesterol/HDL Ratio  3.0   Resulting Agency  [facility]  Specimen Collected: 02/01/21 07:36 Last Resulted: 02/01/21 09:56  Received From: [facility]  Result Received: 02/14/21 13:30   Glucose 70 - 110 mg/dL 96   Sodium 136 - 145 mmol/L 142   Potassium 3.6 - 5.1 mmol/L 4.3   Chloride 97 - 109 mmol/L 108   Carbon Dioxide (CO2) 22.0 - 32.0 mmol/L 27.8   Urea Nitrogen (BUN) 7 - 25 mg/dL 27 High    Creatinine 0.7 - 1.3 mg/dL 1.1   Glomerular Filtration Rate (eGFR), MDRD Estimate >60 mL/min/1.73sq m 65   Calcium 8.7 - 10.3 mg/dL 9.0   AST  8 - 39 U/L 16   ALT  6 - 57 U/L 14   Alk Phos (alkaline Phosphatase) 34 - 104 U/L 88    Albumin 3.5 - 4.8 g/dL 4.2   Bilirubin, Total 0.3 - 1.2 mg/dL 0.4   Protein, Total 6.1 - 7.9 g/dL 6.7   A/G Ratio 1.0 - 5.0 gm/dL 1.7   Resulting Agency  [facility]  Specimen Collected: 02/01/21 07:36 Last Resulted: 02/01/21 09:56  Received From: [facility]  Result Received: 02/14/21 13:30   Urinalysis Component     Latest Ref Rng & Units 05/01/2021  Specific Gravity, UA     1.005 - 1.030 1.025  pH, UA     5.0 - 7.5 6.0  Color, UA     Yellow Yellow  Appearance Ur     Clear Clear  Leukocytes,UA     Negative Negative  Protein,UA     Negative/Trace Negative  Glucose, UA     Negative Negative  Ketones, UA     Negative Negative  RBC, UA     Negative Negative  Bilirubin, UA     Negative Negative  Urobilinogen, Ur     0.2 - 1.0 mg/dL 0.2  Nitrite, UA     Negative Negative  Microscopic Examination      See below:   Component     Latest Ref Rng & Units 05/01/2021  WBC, UA     0 - 5 /hpf 0-5  RBC     0 - 2 /hpf 0-2  Epithelial Cells (non renal)     0 -  10 /hpf 0-10  °Bacteria, UA °    None seen/Few None seen  °I have reviewed the labs. ° ° °Pertinent Imaging: °CLINICAL DATA:  Right flank pain °  °EXAM: °CT ABDOMEN AND PELVIS WITHOUT CONTRAST °  °TECHNIQUE: °Multidetector CT imaging of the abdomen and pelvis was performed °following the standard protocol without IV contrast. °  °COMPARISON:  Prior CT scan of the abdomen and pelvis 09/20/2018 °  °FINDINGS: °Lower chest: No acute abnormality. °  °Hepatobiliary: No acute abnormality. Stable small circumscribed °low-attenuation attic lesions likely reflecting simple cysts. °  °Pancreas: Unremarkable. No pancreatic ductal dilatation or °surrounding inflammatory changes. °  °Spleen: Normal in size without focal abnormality. °  °Adrenals/Urinary Tract: Normal adrenal glands. Mild right-sided °hydronephrosis secondary to a partially obstructing distal ureteral °stone measuring up to 0.6 cm. On the left,  there is a persistent 6 °mm stone in the interpolar collecting system. Interval resolution or °passage of a second smaller stone in the interpolar collecting °system. No evidence of hydronephrosis on the left. Interval °resolution of previously identified 1.9 cm bladder stone. New, or °enlarged previously smaller stone in the right aspect of the bladder °measures up to 0.9 cm. 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 No evidence of obstruction or focal bowel wall °thickening. Normal appendix in the right lower quadrant. The °terminal ileum is unremarkable. °  °Vascular/Lymphatic: Limited evaluation in the absence of intravenous °contrast. No aneurysm, significant atherosclerotic calcification or °plaque and no suspicious lymphadenopathy. °  °Reproductive: Prostate is unremarkable. °  °Other: No abdominal wall hernia or abnormality. No abdominopelvic °ascites. °  °Musculoskeletal: No acute or significant osseous findings. °  °IMPRESSION: °1. Minimally/partially obstructing 6 mm distal right ureteral stone. °2. Persistent 6 mm stone in the left interpolar collecting system. °3. Interval resolution of the previously identified 1.9 cm bladder °stone. A 0.9 cm bladder stone remains. °4. 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 °  °  °Electronically Signed °  By: Heath  McCullough M.D. °  On: 04/03/2021 13:50 °CLINICAL DATA:  History of kidney stones °  °EXAM: °ABDOMEN - 1 VIEW °  °COMPARISON:  04/03/2021 CT °  °FINDINGS: °Scattered large and small bowel gas is noted. Stable left lower pole °renal stone is noted measuring proximally 6 mm. No definitive right °renal calculi are seen. Bladder calculus is noted. The distal right °ureteral stone seen on the prior CT examination is less well °visualized but appears to lie just above the UVJ on the right. °Multiple phleboliths are seen. Changes of prior urolift are noted. °   °IMPRESSION: °Stable 6 mm left renal stone. °  °Faint density seen just above the known bladder stone on the right °which may represent the previously noted distal right ureteral °stone. °  °Stable bladder calculus. °  °  °Electronically Signed °  By: Mark  Lukens M.D. °  On: 05/02/2021 01:05 °I have independently reviewed the films.  See HPI.   ° °Assessment & Plan:   ° °1. Right ureteral stone °-KUB stone has passed into the bladder at this time °-follow up in 6 months for KUB ° °2. Left renal stone  °-stable  ° °3. Bladder stone °-stable °-He does not desire any intervention at this time ° °4. BPH with LUTS °-PSA vacillating  °-UA benign °-continue conservative management, avoiding bladder irritants and timed voiding's ° °5. Elevated PSA °-PSA elevated in the setting of bladder and ureteral stone °-will need to repeat at   some point   °   ° °Return in about 6 months (around 10/29/2021) for KUB, PSA, IPSS and PVR . ° °These notes generated with voice recognition software. I apologize for typographical errors. ° °SHANNON MCGOWAN, PA-C ° °[facility] °[address]  [address] °[place], [place] [address] °[phone] °  °

## 2021-05-01 ENCOUNTER — Ambulatory Visit: Payer: PPO | Admitting: Urology

## 2021-05-01 ENCOUNTER — Encounter: Payer: Self-pay | Admitting: Urology

## 2021-05-01 ENCOUNTER — Ambulatory Visit
Admission: RE | Admit: 2021-05-01 | Discharge: 2021-05-01 | Disposition: A | Discharge status: Home / Self Care | Payer: PPO | Source: Ambulatory Visit | Attending: Urology | Admitting: Urology

## 2021-05-01 ENCOUNTER — Ambulatory Visit
Admission: RE | Admit: 2021-05-01 | Discharge: 2021-05-01 | Disposition: A | Discharge status: Home / Self Care | Payer: PPO | Attending: Urology | Admitting: Urology

## 2021-05-01 ENCOUNTER — Other Ambulatory Visit: Payer: Self-pay

## 2021-05-01 VITALS — BP 139/81 | HR 71 | Ht 68.0 in | Wt 190.0 lb

## 2021-05-01 DIAGNOSIS — N201 Calculus of ureter: Secondary | ICD-10-CM

## 2021-05-01 DIAGNOSIS — N401 Enlarged prostate with lower urinary tract symptoms: Secondary | ICD-10-CM

## 2021-05-01 DIAGNOSIS — N210 Calculus in bladder: Secondary | ICD-10-CM

## 2021-05-01 DIAGNOSIS — N138 Other obstructive and reflux uropathy: Secondary | ICD-10-CM | POA: Diagnosis not present

## 2021-05-01 DIAGNOSIS — I878 Other specified disorders of veins: Secondary | ICD-10-CM | POA: Diagnosis not present

## 2021-05-01 DIAGNOSIS — R972 Elevated prostate specific antigen [PSA]: Secondary | ICD-10-CM | POA: Diagnosis not present

## 2021-05-01 DIAGNOSIS — N200 Calculus of kidney: Secondary | ICD-10-CM

## 2021-05-01 LAB — URINALYSIS, COMPLETE
Bilirubin, UA: NEGATIVE
Glucose, UA: NEGATIVE
Ketones, UA: NEGATIVE
Leukocytes,UA: NEGATIVE
Nitrite, UA: NEGATIVE
Protein,UA: NEGATIVE
RBC, UA: NEGATIVE
Specific Gravity, UA: 1.025 (ref 1.005–1.030)
Urobilinogen, Ur: 0.2 mg/dL (ref 0.2–1.0)
pH, UA: 6 (ref 5.0–7.5)

## 2021-05-01 LAB — MICROSCOPIC EXAMINATION: Bacteria, UA: NONE SEEN

## 2021-05-05 LAB — CULTURE, URINE COMPREHENSIVE

## 2021-05-07 DIAGNOSIS — H52223 Regular astigmatism, bilateral: Secondary | ICD-10-CM | POA: Diagnosis not present

## 2021-05-07 DIAGNOSIS — H2513 Age-related nuclear cataract, bilateral: Secondary | ICD-10-CM | POA: Diagnosis not present

## 2021-05-07 DIAGNOSIS — H5203 Hypermetropia, bilateral: Secondary | ICD-10-CM | POA: Diagnosis not present

## 2021-05-07 DIAGNOSIS — H524 Presbyopia: Secondary | ICD-10-CM | POA: Diagnosis not present

## 2021-05-07 DIAGNOSIS — H30012 Focal chorioretinal inflammation, juxtapapillary, left eye: Secondary | ICD-10-CM | POA: Diagnosis not present

## 2021-10-23 ENCOUNTER — Other Ambulatory Visit: Payer: Self-pay

## 2021-10-23 DIAGNOSIS — N200 Calculus of kidney: Secondary | ICD-10-CM

## 2021-10-26 ENCOUNTER — Ambulatory Visit: Payer: PPO | Admitting: Urology

## 2021-10-31 NOTE — Progress Notes (Unsigned)
11/01/2021 9:26 AM   Maxwell Jones 16-Jun-1945 [id number]  Referring provider: Baxter Hire, MD [place],  [place] [address]  Urological history: 1. Elevated PSA -PSA Trend  Component     Latest Ref Rng & Units 06/30/2017 12/25/2017 12/31/2018 02/23/2019  Prostate Specific Ag, Serum     0.0 - 4.0 ng/mL 2.1 2.7 43.9 (H) 6.7 (H)   Component     Latest Ref Rng & Units 05/28/2019 02/29/2020 02/26/2021  Prostate Specific Ag, Serum     0.0 - 4.0 ng/mL 5.1 (H) 3.6 4.3 (H)   Prostate biopsy 2016 - negative  2. Nephrolithiasis -left URS 2015 -CT renal stone 04/2021 - 6 mm distal right ureteral stone, 6 mm stone left renal stone  3. Bladder stone -cystolitholapaxy 2020 -CT renal stone 04/2021 - 0.9 cm bladder stone  4. BPH with LU TS -prostate volume on CT 2020 - 5.0 x 5.5 x 4.4 cm  -cysto 2020 - Enlarged prostate trilobar coaptation -s/p UroLift, 4 implants - 2020 -I PSS 3/0  Chief Complaint  Patient presents with   Benign Prostatic Hypertrophy   Nephrolithiasis   Elevated PSA    HPI: Maxwell Jones is a 76 y.o. male who presents today for 6 month follow up.  He has some slowing of the urinary stream in the am.  Patient denies any modifying or aggravating factors.  Patient denies any gross hematuria, dysuria or suprapubic/flank pain.  Patient denies any fevers, chills, nausea or vomiting.    UA clear  KUB 6 mm left renal stone.  1.6 cm bladder stone and 6 mm bladder stone.     IPSS     Row Name 11/01/21 0800         International Prostate Symptom Score   How often have you had the sensation of not emptying your bladder? Not at All     How often have you had to urinate less than every two hours? Not at All     How often have you found you stopped and started again several times when you urinated? Less than 1 in 5 times     How often have you found it difficult to postpone urination? Not at All     How often have you had a weak urinary stream? Less  than 1 in 5 times     How often have you had to strain to start urination? Not at All     How many times did you typically get up at night to urinate? 1 Time     Total IPSS Score 3       Quality of Life due to urinary symptoms   If you were to spend the rest of your life with your urinary condition just the way it is now how would you feel about that? Delighted              Score:  1-7 Mild 8-19 Moderate 20-35 Severe   PMH: Past Medical History:  Diagnosis Date   Bladder calculi    BPH (benign prostatic hyperplasia)    History of kidney stones    Hypogonadism in male    Kidney stone on left side    Prostatitis    Urinary hesitancy     Surgical History: Past Surgical History:  Procedure Laterality Date   Cystolithalopaxy w/Holmium laser     CYSTOSCOPY WITH INSERTION OF UROLIFT N/A 01/11/2019   Procedure: CYSTOSCOPY WITH INSERTION OF UROLIFT;  Surgeon:  Hollice Espy, MD;  Location: [facility];  Service: Urology;  Laterality: N/A;   CYSTOSCOPY WITH LITHOLAPAXY N/A 01/11/2019   Procedure: CYSTOSCOPY WITH LITHOLAPAXY;  Surgeon: Hollice Espy, MD;  Location: [facility];  Service: Urology;  Laterality: N/A;   EXTRACORPOREAL SHOCK WAVE LITHOTRIPSY     WISDOM TOOTH EXTRACTION      Home Medications:  Allergies as of 11/01/2021       Reactions   Iodinated Contrast Media Rash   Minocycline Rash        Medication List        Accurate as of November 01, 2021  9:26 AM. If you have any questions, ask your nurse or doctor.          acetaminophen 325 MG tablet Commonly known as: TYLENOL Take 650 mg by mouth every 6 (six) hours as needed (for pain.).   ondansetron 4 MG disintegrating tablet Commonly known as: ZOFRAN-ODT Take 1 tablet (4 mg total) by mouth every 8 (eight) hours as needed for nausea or vomiting.        Allergies:  Allergies  Allergen Reactions   Iodinated Contrast Media Rash   Minocycline Rash    Family History: Family History  Problem  Relation Age of Onset   Nephrolithiasis Mother    Kidney cancer Brother    Liver cancer Brother    Squamous cell carcinoma Brother    Prostate cancer Neg Hx    Bladder Cancer Neg Hx     Social History:  reports that he has never smoked. He has never used smokeless tobacco. He reports current alcohol use. He reports that he does not use drugs.  ROS: Pertinent ROS in HPI  Physical Exam: BP 122/73   Pulse 72   Ht '5\' 8"'$  (1.727 m)   Wt 190 lb (86.2 kg)   BMI 28.89 kg/m   Constitutional:  Well nourished. Alert and oriented, No acute distress. HEENT:  AT, moist mucus membranes.  Trachea midline Cardiovascular: No clubbing, cyanosis, or edema. Respiratory: Normal respiratory effort, no increased work of breathing. GU: No CVA tenderness.  No bladder fullness or masses.  Patient with uncircumcised phallus. Foreskin easily retracted  Urethral meatus is patent.  No penile discharge. No penile lesions or rashes. Scrotum without lesions, cysts, rashes and/or edema.  Testicles are located scrotally bilaterally. No masses are appreciated in the testicles. Left and right epididymis are normal. Rectal: Patient with  normal sphincter tone. Anus and perineum without scarring or rashes. No rectal masses are appreciated. Prostate is approximately 50 + grams, could only palpate the apex and the midportion of the gland no nodules are appreciated. Seminal vesicles could not be palpated Neurologic: Grossly intact, no focal deficits, moving all 4 extremities. Psychiatric: Normal mood and affect.   Laboratory Data: Urinalysis See Epic and HPI I have reviewed the labs.   Pertinent Imaging: KUB - 6 mm stable left renal stone, 6 mm and 1.6 cm bladder stone I have independently reviewed the films.  See HPI.    Assessment & Plan:    1. Left renal stone  - stable  - no complaints of renal colic  3. Bladder stone - appears to have two bladder stones at this time - discussed that bladder stones may  increase the risk of bleeding, UTI's and retention - he does not desire any treatment of the bladder stones at this time  4. BPH with LUTS -PSA pending -DRE benign -UA benign -continue conservative management, avoiding bladder irritants and timed voiding's  5. Vacillating PSA -current PSA pending -if trending upward, will need to repeat in four months to see if the upward trend continues    Return in about 1 year (around 11/02/2022) for PSA, UA, KUB, I PSS and exam .  These notes generated with voice recognition software. I apologize for typographical errors.  Zara Council, PA-C  [facility] [address]  [place], [place] [address] [phone]

## 2021-11-01 ENCOUNTER — Ambulatory Visit
Admission: RE | Admit: 2021-11-01 | Discharge: 2021-11-01 | Disposition: A | Discharge status: Home / Self Care | Payer: PPO | Source: Ambulatory Visit | Attending: Urology | Admitting: Urology

## 2021-11-01 ENCOUNTER — Encounter: Payer: Self-pay | Admitting: Urology

## 2021-11-01 ENCOUNTER — Ambulatory Visit: Payer: PPO | Admitting: Urology

## 2021-11-01 VITALS — BP 122/73 | HR 72 | Ht 68.0 in | Wt 190.0 lb

## 2021-11-01 DIAGNOSIS — R972 Elevated prostate specific antigen [PSA]: Secondary | ICD-10-CM

## 2021-11-01 DIAGNOSIS — N401 Enlarged prostate with lower urinary tract symptoms: Secondary | ICD-10-CM | POA: Diagnosis not present

## 2021-11-01 DIAGNOSIS — N200 Calculus of kidney: Secondary | ICD-10-CM

## 2021-11-01 DIAGNOSIS — N210 Calculus in bladder: Secondary | ICD-10-CM | POA: Diagnosis not present

## 2021-11-01 DIAGNOSIS — I878 Other specified disorders of veins: Secondary | ICD-10-CM | POA: Diagnosis not present

## 2021-11-01 DIAGNOSIS — N138 Other obstructive and reflux uropathy: Secondary | ICD-10-CM

## 2021-11-01 LAB — URINALYSIS, COMPLETE
Bilirubin, UA: NEGATIVE
Glucose, UA: NEGATIVE
Ketones, UA: NEGATIVE
Leukocytes,UA: NEGATIVE
Nitrite, UA: NEGATIVE
Protein,UA: NEGATIVE
RBC, UA: NEGATIVE
Specific Gravity, UA: 1.025 (ref 1.005–1.030)
Urobilinogen, Ur: 0.2 mg/dL (ref 0.2–1.0)
pH, UA: 5.5 (ref 5.0–7.5)

## 2021-11-01 LAB — MICROSCOPIC EXAMINATION: Bacteria, UA: NONE SEEN

## 2021-11-02 ENCOUNTER — Telehealth: Payer: Self-pay

## 2021-11-02 ENCOUNTER — Other Ambulatory Visit: Payer: Self-pay

## 2021-11-02 DIAGNOSIS — R972 Elevated prostate specific antigen [PSA]: Secondary | ICD-10-CM

## 2021-11-02 DIAGNOSIS — N200 Calculus of kidney: Secondary | ICD-10-CM

## 2021-11-02 DIAGNOSIS — N138 Other obstructive and reflux uropathy: Secondary | ICD-10-CM

## 2021-11-02 LAB — PSA: Prostate Specific Ag, Serum: 5.9 ng/mL — ABNORMAL HIGH (ref 0.0–4.0)

## 2021-11-02 NOTE — Telephone Encounter (Signed)
Pt notified via mychart, see separate encounter.

## 2021-11-02 NOTE — Telephone Encounter (Signed)
-----   Message from Nori Riis, PA-C sent at 11/02/2021  7:53 AM EDT ----- Please let Mr. Mahrt know that his PSA has increased from 4.3 in November to 5.9.  We have him scheduled to recheck it in November and I recommend to keep that lab appointment.

## 2022-01-31 DIAGNOSIS — N400 Enlarged prostate without lower urinary tract symptoms: Secondary | ICD-10-CM | POA: Diagnosis not present

## 2022-01-31 DIAGNOSIS — N200 Calculus of kidney: Secondary | ICD-10-CM | POA: Diagnosis not present

## 2022-01-31 DIAGNOSIS — E663 Overweight: Secondary | ICD-10-CM | POA: Diagnosis not present

## 2022-02-05 DIAGNOSIS — H43812 Vitreous degeneration, left eye: Secondary | ICD-10-CM | POA: Diagnosis not present

## 2022-02-11 DIAGNOSIS — H43822 Vitreomacular adhesion, left eye: Secondary | ICD-10-CM | POA: Diagnosis not present

## 2022-02-28 ENCOUNTER — Other Ambulatory Visit: Payer: PPO

## 2022-02-28 DIAGNOSIS — R972 Elevated prostate specific antigen [PSA]: Secondary | ICD-10-CM

## 2022-03-01 LAB — PSA: Prostate Specific Ag, Serum: 5.1 ng/mL — ABNORMAL HIGH (ref 0.0–4.0)

## 2022-03-06 ENCOUNTER — Ambulatory Visit: Payer: PPO | Admitting: Urology

## 2022-03-06 VITALS — BP 136/75 | HR 73 | Ht 68.0 in | Wt 188.0 lb

## 2022-03-06 DIAGNOSIS — R972 Elevated prostate specific antigen [PSA]: Secondary | ICD-10-CM | POA: Diagnosis not present

## 2022-03-06 DIAGNOSIS — N138 Other obstructive and reflux uropathy: Secondary | ICD-10-CM

## 2022-03-06 DIAGNOSIS — N401 Enlarged prostate with lower urinary tract symptoms: Secondary | ICD-10-CM | POA: Diagnosis not present

## 2022-03-06 DIAGNOSIS — N200 Calculus of kidney: Secondary | ICD-10-CM

## 2022-03-06 LAB — BLADDER SCAN AMB NON-IMAGING: Scan Result: 58

## 2022-03-06 NOTE — Progress Notes (Signed)
03/06/2022 9:49 AM   Maxwell Jones [birth year] [id number]  Referring provider: Baxter Hire, MD [place],  [place] [address]  Chief Complaint  Patient presents with   Elevated PSA    HPI: 76 year old male with a personal history of BPH status post UroLift, kidney and bladder stones and elevated PSA who returns today for 30-monthfollow-up.  He has persistent fluctuating PSA up to the 40s.  His most recent showed [place], his PSA was rising slightly.  He returns today with a repeat PSA which now downward trending, 5.1.  Overall, he is doing extremely well.  He is voiding without difficulty.  He has no complaints today.  He is extremely pleased.  No flank pain or kidney stone issues.  Results for orders placed or performed in visit on 03/06/22  Bladder Scan (Post Void Residual) in office  Result Value Ref Range   Scan Result 58        IPSS     Row Name 03/06/22 0900         International Prostate Symptom Score   How often have you had the sensation of not emptying your bladder? Not at All     How often have you had to urinate less than every two hours? Not at All     How often have you found you stopped and started again several times when you urinated? Less than 1 in 5 times     How often have you found it difficult to postpone urination? Less than 1 in 5 times     How often have you had a weak urinary stream? Less than 1 in 5 times     How often have you had to strain to start urination? Not at All     How many times did you typically get up at night to urinate? 1 Time     Total IPSS Score 4       Quality of Life due to urinary symptoms   If you were to spend the rest of your life with your urinary condition just the way it is now how would you feel about that? Delighted              Score:  1-7 Mild 8-19 Moderate 20-35 Severe   PMH: Past Medical History:  Diagnosis Date   Bladder calculi    BPH (benign prostatic hyperplasia)     History of kidney stones    Hypogonadism in male    Kidney stone on left side    Prostatitis    Urinary hesitancy     Surgical History: Past Surgical History:  Procedure Laterality Date   Cystolithalopaxy w/Holmium laser     CYSTOSCOPY WITH INSERTION OF UROLIFT N/A 01/11/2019   Procedure: CYSTOSCOPY WITH INSERTION OF UROLIFT;  Surgeon: BHollice Espy MD;  Location: [facility];  Service: Urology;  Laterality: N/A;   CYSTOSCOPY WITH LITHOLAPAXY N/A 01/11/2019   Procedure: CYSTOSCOPY WITH LITHOLAPAXY;  Surgeon: BHollice Espy MD;  Location: [facility];  Service: Urology;  Laterality: N/A;   EXTRACORPOREAL SHOCK WAVE LITHOTRIPSY     WISDOM TOOTH EXTRACTION      Home Medications:  Allergies as of 03/06/2022       Reactions   Iodinated Contrast Media Rash   Minocycline Rash        Medication List        Accurate as of March 06, 2022  9:49 AM. If you have any questions, ask your nurse  or doctor.          STOP taking these medications    ondansetron 4 MG disintegrating tablet Commonly known as: ZOFRAN-ODT       TAKE these medications    acetaminophen 325 MG tablet Commonly known as: TYLENOL Take 650 mg by mouth every 6 (six) hours as needed (for pain.).   prednisoLONE acetate 1 % ophthalmic suspension Commonly known as: PRED FORTE Place 1 drop into the left eye 4 (four) times daily.        Allergies:  Allergies  Allergen Reactions   Iodinated Contrast Media Rash   Minocycline Rash    Family History: Family History  Problem Relation Age of Onset   Nephrolithiasis Mother    Kidney cancer Brother    Liver cancer Brother    Squamous cell carcinoma Brother    Prostate cancer Neg Hx    Bladder Cancer Neg Hx     Social History:  reports that he has never smoked. He has never used smokeless tobacco. He reports current alcohol use. He reports that he does not use drugs.   Physical Exam: BP 136/75   Pulse 73   Ht '5\' 8"'$  (1.727 m)   Wt 188 lb (85.3  kg)   BMI 28.59 kg/m   Constitutional:  Alert and oriented, No acute distress. HEENT: [place] AT, moist mucus membranes.  Trachea midline, no masses. Cardiovascular: No clubbing, cyanosis, or edema. Respiratory: Normal respiratory effort, no increased work of breathing. Neurologic: Grossly intact, no focal deficits, moving all 4 extremities. Psychiatric: Normal mood and affect.  Laboratory Data: Lab Results  Component Value Date   WBC 4.2 01/07/2019   HGB 15.0 01/07/2019   HCT 44.5 01/07/2019   MCV 93.3 01/07/2019   PLT 278 01/07/2019    Lab Results  Component Value Date   CREATININE 0.96 01/07/2019     Assessment & Plan:    1. Elevated PSA History of PSA fluctuation now downward trending  Reasonable to follow this conservatively given his history  Repeat PSA at his follow-up visit in 9 months - Bladder Scan (Post Void Residual) in office  2. BPH with obstruction/lower urinary tract symptoms Asymptomatic, doing well status post UroLift - Bladder Scan (Post Void Residual) in office  3. Nephrolithiasis Nonobstructing 6 mm left stone, asymptomatic  Conservative management    F/u 10/2022 as scheduled with Barry, MD  [facility] [address], [place], [place] [address] [phone]

## 2022-03-18 DIAGNOSIS — H34832 Tributary (branch) retinal vein occlusion, left eye, with macular edema: Secondary | ICD-10-CM | POA: Diagnosis not present

## 2022-04-16 DIAGNOSIS — Z125 Encounter for screening for malignant neoplasm of prostate: Secondary | ICD-10-CM | POA: Diagnosis not present

## 2022-04-16 DIAGNOSIS — Z0000 Encounter for general adult medical examination without abnormal findings: Secondary | ICD-10-CM | POA: Diagnosis not present

## 2022-04-22 DIAGNOSIS — H34832 Tributary (branch) retinal vein occlusion, left eye, with macular edema: Secondary | ICD-10-CM | POA: Diagnosis not present

## 2022-04-22 DIAGNOSIS — H35352 Cystoid macular degeneration, left eye: Secondary | ICD-10-CM | POA: Diagnosis not present

## 2022-05-01 DIAGNOSIS — L570 Actinic keratosis: Secondary | ICD-10-CM | POA: Diagnosis not present

## 2022-05-01 DIAGNOSIS — D2262 Melanocytic nevi of left upper limb, including shoulder: Secondary | ICD-10-CM | POA: Diagnosis not present

## 2022-05-01 DIAGNOSIS — D225 Melanocytic nevi of trunk: Secondary | ICD-10-CM | POA: Diagnosis not present

## 2022-05-01 DIAGNOSIS — D2272 Melanocytic nevi of left lower limb, including hip: Secondary | ICD-10-CM | POA: Diagnosis not present

## 2022-05-01 DIAGNOSIS — R202 Paresthesia of skin: Secondary | ICD-10-CM | POA: Diagnosis not present

## 2022-05-01 DIAGNOSIS — D2271 Melanocytic nevi of right lower limb, including hip: Secondary | ICD-10-CM | POA: Diagnosis not present

## 2022-05-01 DIAGNOSIS — D2261 Melanocytic nevi of right upper limb, including shoulder: Secondary | ICD-10-CM | POA: Diagnosis not present

## 2022-05-01 DIAGNOSIS — L821 Other seborrheic keratosis: Secondary | ICD-10-CM | POA: Diagnosis not present

## 2022-05-01 DIAGNOSIS — X32XXXA Exposure to sunlight, initial encounter: Secondary | ICD-10-CM | POA: Diagnosis not present

## 2022-05-22 DIAGNOSIS — Z0000 Encounter for general adult medical examination without abnormal findings: Secondary | ICD-10-CM | POA: Diagnosis not present

## 2022-05-22 DIAGNOSIS — N400 Enlarged prostate without lower urinary tract symptoms: Secondary | ICD-10-CM | POA: Diagnosis not present

## 2022-05-22 DIAGNOSIS — Z0001 Encounter for general adult medical examination with abnormal findings: Secondary | ICD-10-CM | POA: Diagnosis not present

## 2022-05-27 DIAGNOSIS — H35352 Cystoid macular degeneration, left eye: Secondary | ICD-10-CM | POA: Diagnosis not present

## 2022-07-01 DIAGNOSIS — H34832 Tributary (branch) retinal vein occlusion, left eye, with macular edema: Secondary | ICD-10-CM | POA: Diagnosis not present

## 2022-07-01 DIAGNOSIS — H35352 Cystoid macular degeneration, left eye: Secondary | ICD-10-CM | POA: Diagnosis not present

## 2022-07-02 DIAGNOSIS — M5136 Other intervertebral disc degeneration, lumbar region: Secondary | ICD-10-CM | POA: Diagnosis not present

## 2022-07-02 DIAGNOSIS — M4306 Spondylolysis, lumbar region: Secondary | ICD-10-CM | POA: Diagnosis not present

## 2022-07-02 DIAGNOSIS — M9903 Segmental and somatic dysfunction of lumbar region: Secondary | ICD-10-CM | POA: Diagnosis not present

## 2022-07-02 DIAGNOSIS — M5417 Radiculopathy, lumbosacral region: Secondary | ICD-10-CM | POA: Diagnosis not present

## 2022-07-08 DIAGNOSIS — M9903 Segmental and somatic dysfunction of lumbar region: Secondary | ICD-10-CM | POA: Diagnosis not present

## 2022-07-08 DIAGNOSIS — M5136 Other intervertebral disc degeneration, lumbar region: Secondary | ICD-10-CM | POA: Diagnosis not present

## 2022-07-08 DIAGNOSIS — M5417 Radiculopathy, lumbosacral region: Secondary | ICD-10-CM | POA: Diagnosis not present

## 2022-07-08 DIAGNOSIS — M4306 Spondylolysis, lumbar region: Secondary | ICD-10-CM | POA: Diagnosis not present

## 2022-07-18 DIAGNOSIS — N200 Calculus of kidney: Secondary | ICD-10-CM | POA: Diagnosis not present

## 2022-07-18 DIAGNOSIS — N400 Enlarged prostate without lower urinary tract symptoms: Secondary | ICD-10-CM | POA: Diagnosis not present

## 2022-07-18 DIAGNOSIS — E663 Overweight: Secondary | ICD-10-CM | POA: Diagnosis not present

## 2022-07-22 DIAGNOSIS — M9903 Segmental and somatic dysfunction of lumbar region: Secondary | ICD-10-CM | POA: Diagnosis not present

## 2022-07-22 DIAGNOSIS — M5417 Radiculopathy, lumbosacral region: Secondary | ICD-10-CM | POA: Diagnosis not present

## 2022-07-22 DIAGNOSIS — M5136 Other intervertebral disc degeneration, lumbar region: Secondary | ICD-10-CM | POA: Diagnosis not present

## 2022-07-22 DIAGNOSIS — M4306 Spondylolysis, lumbar region: Secondary | ICD-10-CM | POA: Diagnosis not present

## 2022-07-31 DIAGNOSIS — B351 Tinea unguium: Secondary | ICD-10-CM | POA: Diagnosis not present

## 2022-08-05 DIAGNOSIS — H34832 Tributary (branch) retinal vein occlusion, left eye, with macular edema: Secondary | ICD-10-CM | POA: Diagnosis not present

## 2022-08-06 DIAGNOSIS — M9903 Segmental and somatic dysfunction of lumbar region: Secondary | ICD-10-CM | POA: Diagnosis not present

## 2022-08-06 DIAGNOSIS — M5417 Radiculopathy, lumbosacral region: Secondary | ICD-10-CM | POA: Diagnosis not present

## 2022-08-06 DIAGNOSIS — M4306 Spondylolysis, lumbar region: Secondary | ICD-10-CM | POA: Diagnosis not present

## 2022-08-06 DIAGNOSIS — M5136 Other intervertebral disc degeneration, lumbar region: Secondary | ICD-10-CM | POA: Diagnosis not present

## 2022-09-16 DIAGNOSIS — H34832 Tributary (branch) retinal vein occlusion, left eye, with macular edema: Secondary | ICD-10-CM | POA: Diagnosis not present

## 2022-10-10 DIAGNOSIS — B351 Tinea unguium: Secondary | ICD-10-CM | POA: Diagnosis not present

## 2022-10-10 DIAGNOSIS — L853 Xerosis cutis: Secondary | ICD-10-CM | POA: Diagnosis not present

## 2022-10-22 DIAGNOSIS — H35352 Cystoid macular degeneration, left eye: Secondary | ICD-10-CM | POA: Diagnosis not present

## 2022-10-22 DIAGNOSIS — H34832 Tributary (branch) retinal vein occlusion, left eye, with macular edema: Secondary | ICD-10-CM | POA: Diagnosis not present

## 2022-10-28 ENCOUNTER — Other Ambulatory Visit: Payer: Self-pay

## 2022-10-28 DIAGNOSIS — N401 Enlarged prostate with lower urinary tract symptoms: Secondary | ICD-10-CM

## 2022-10-28 DIAGNOSIS — R972 Elevated prostate specific antigen [PSA]: Secondary | ICD-10-CM

## 2022-10-29 ENCOUNTER — Other Ambulatory Visit: Payer: PPO

## 2022-10-29 DIAGNOSIS — N401 Enlarged prostate with lower urinary tract symptoms: Secondary | ICD-10-CM | POA: Diagnosis not present

## 2022-10-29 DIAGNOSIS — N200 Calculus of kidney: Secondary | ICD-10-CM | POA: Diagnosis not present

## 2022-10-29 DIAGNOSIS — N138 Other obstructive and reflux uropathy: Secondary | ICD-10-CM | POA: Diagnosis not present

## 2022-10-29 DIAGNOSIS — R972 Elevated prostate specific antigen [PSA]: Secondary | ICD-10-CM

## 2022-10-29 LAB — URINALYSIS, COMPLETE
Bilirubin, UA: NEGATIVE
Glucose, UA: NEGATIVE
Ketones, UA: NEGATIVE
Leukocytes,UA: NEGATIVE
Nitrite, UA: NEGATIVE
Protein,UA: NEGATIVE
RBC, UA: NEGATIVE
Specific Gravity, UA: 1.02 (ref 1.005–1.030)
Urobilinogen, Ur: 0.2 mg/dL (ref 0.2–1.0)
pH, UA: 6.5 (ref 5.0–7.5)

## 2022-10-29 LAB — MICROSCOPIC EXAMINATION

## 2022-10-31 ENCOUNTER — Other Ambulatory Visit: Payer: Self-pay | Admitting: Urology

## 2022-10-31 DIAGNOSIS — N200 Calculus of kidney: Secondary | ICD-10-CM

## 2022-10-31 NOTE — Progress Notes (Addendum)
11/01/2022 9:45 AM   [name] [birth year] [id number]  Referring provider: Gracelyn Nurse, MD [address] [place],  [place] [address]  Urological history: 1.  Elevated PSA -prostate biopsy (2016) - negative -PSA (2020) 43.9   2. Nephrolithiasis -left URS 2015 -CT renal stone 04/2021 - 6 mm distal right ureteral stone, 6 mm stone left renal stone -KUB (10/2021) - 6 mm left stone    3. Bladder stone -cystolitholapaxy 2020 -CT renal stone 04/2021 - 0.9 cm bladder stone   4. BPH with LU TS -PSA (09/2022) 6.2  -prostate volume on CT 2023 - ~ 99 cc -cysto 2020 - Enlarged prostate trilobar coaptation -s/p UroLift, 4 implants - 2020  Chief Complaint Patient presents with  Follow-up  Elevated PSA  HPI: Maxwell Jones is a 77 y.o. male who presents today for yearly follow up.    Previous records reviewed.   KUB stable left renal stone and stable bladder stones.   His recent PSA was 6.2 making his PSA density 0.062.  I PSS 4/0  No urinary complaints.  Patient denies any modifying or aggravating factors.  Patient denies any recent UTI's, gross hematuria, dysuria or suprapubic/flank pain.  Patient denies any fevers, chills, nausea or vomiting.  IPSS  Row Name 11/01/22 0900 International Prostate Symptom Score How often have you had the sensation of not emptying your bladder? Not at All How often have you had to urinate less than every two hours? Not at All How often have you found you stopped and started again several times when you urinated? Less than 1 in 5 times How often have you found it difficult to postpone urination? Less than 1 in 5 times How often have you had a weak urinary stream? Less than 1 in 5 times How often have you had to strain to start urination? Not at All How many times did you typically get up at night to urinate? 1 Time Total IPSS Score 4 Quality of Life due to urinary symptoms If you were to spend the rest of your life with  your urinary condition just the way it is now how would you feel about that? Delighted   IPSS  Row Name 11/01/22 0900 International Prostate Symptom Score How often have you had the sensation of not emptying your bladder? Not at All How often have you had to urinate less than every two hours? Not at All How often have you found you stopped and started again several times when you urinated? Less than 1 in 5 times How often have you found it difficult to postpone urination? Less than 1 in 5 times How often have you had a weak urinary stream? Less than 1 in 5 times How often have you had to strain to start urination? Not at All How many times did you typically get up at night to urinate? 1 Time Total IPSS Score 4 Quality of Life due to urinary symptoms If you were to spend the rest of your life with your urinary condition just the way it is now how would you feel about that? Delighted    Score:  1-7 Mild 8-19 Moderate 20-35 Severe   PMH: Past Medical History: Diagnosis Date  Bladder calculi  BPH (benign prostatic hyperplasia)  History of kidney stones  Hypogonadism in male  Kidney stone on left side  Prostatitis  Urinary hesitancy   Surgical History: Past Surgical History: Procedure Laterality Date  Cystolithalopaxy w/Holmium laser  CYSTOSCOPY WITH INSERTION  OF UROLIFT N/A 01/11/2019 Procedure: CYSTOSCOPY WITH INSERTION OF UROLIFT;  Surgeon: Vanna Scotland, MD;  Location: [facility];  Service: Urology;  Laterality: N/A;  CYSTOSCOPY WITH LITHOLAPAXY N/A 01/11/2019 Procedure: CYSTOSCOPY WITH LITHOLAPAXY;  Surgeon: Vanna Scotland, MD;  Location: [facility];  Service: Urology;  Laterality: N/A;  EXTRACORPOREAL SHOCK WAVE LITHOTRIPSY  WISDOM TOOTH EXTRACTION   Home Medications:  Allergies as of 11/01/2022     Reactions Iodinated Contrast Media Rash Minocycline Rash   Medication List   Accurate as of November 01, 2022  9:45 AM. If you have  any questions, ask your nurse or doctor.   STOP taking these medications   prednisoLONE acetate 1 % ophthalmic suspension Commonly known as: PRED FORTE Stopped by:     TAKE these medications   acetaminophen 325 MG tablet Commonly known as: TYLENOL Take 650 mg by mouth every 6 (six) hours as needed (for pain.).    Allergies:  Allergies Allergen Reactions  Iodinated Contrast Media Rash  Minocycline Rash   Family History: Family History Problem Relation Age of Onset  Nephrolithiasis Mother  Kidney cancer Brother  Liver cancer Brother  Squamous cell carcinoma Brother  Prostate cancer Neg Hx  Bladder Cancer Neg Hx   Social History:  reports that he has never smoked. He has never used smokeless tobacco. He reports current alcohol use. He reports that he does not use drugs.  ROS: Pertinent ROS in HPI  Physical Exam: BP 119/64   Pulse 66   Wt 184 lb 14.4 oz (83.9 kg)   BMI 28.11 kg/m   Constitutional:  Well nourished. Alert and oriented, No acute distress. HEENT: [place] AT, moist mucus membranes.  Trachea midline Cardiovascular: No clubbing, cyanosis, or edema. Respiratory: Normal respiratory effort, no increased work of breathing. GU: No CVA tenderness.  No bladder fullness or masses.  Patient with uncircumcised phallus.  Foreskin easily retracted  Urethral meatus is patent.  No penile discharge. No penile lesions or rashes. Scrotum without lesions, cysts, rashes and/or edema.  Testicles are located scrotally bilaterally. No masses are appreciated in the testicles. Left and right epididymis are normal. Rectal: Patient with  normal sphincter tone. Anus and perineum without scarring or rashes. No rectal masses are appreciated. Prostate is approximately 60 + grams, could only palpate the apex and midportion of the gland, no nodules are appreciated. Seminal vesicles could not be palpated.   Neurologic: Grossly intact, no focal deficits, moving  all 4 extremities. Psychiatric: Normal mood and affect.  Laboratory Data: Results for orders placed or performed in visit on 10/29/22 Microscopic Examination Urine Result Value Ref Range WBC, UA 0-5 0 - 5 /hpf RBC, Urine 0-2 0 - 2 /hpf Epithelial Cells (non renal) 0-10 0 - 10 /hpf Bacteria, UA Few None seen/Few PSA Result Value Ref Range Prostate Specific Ag, Serum 6.2 (H) 0.0 - 4.0 ng/mL Urinalysis, Complete Result Value Ref Range Specific Gravity, UA 1.020 1.005 - 1.030 pH, UA 6.5 5.0 - 7.5 Color, UA Yellow Yellow Appearance Ur Clear Clear Leukocytes,UA Negative Negative Protein,UA Negative Negative/Trace Glucose, UA Negative Negative Ketones, UA Negative Negative RBC, UA Negative Negative Bilirubin, UA Negative Negative Urobilinogen, Ur 0.2 0.2 - 1.0 mg/dL Nitrite, UA Negative Negative Microscopic Examination See below: I have reviewed the labs.   Pertinent Imaging: CLINICAL DATA:  Yearly follow-up due to history of chronic kidney stones.   EXAM: ABDOMEN - 1 VIEW   COMPARISON:  11/01/2021   FINDINGS: Bowel gas pattern is nonobstructive. Stable 5 mm stone projects over  the lower pole left kidney. No definite evidence of right-sided renal stones. Multiple pelvic phleboliths unchanged. Surgical clips over the midline pelvis likely due to previous prostatectomy. Remainder of the exam is unchanged.   IMPRESSION: 1. Nonobstructive bowel gas pattern. 2. Stable 5 mm left renal stone.     Electronically Signed   By: Elberta Fortis M.D.   On: 11/07/2022 16:35      I have independently reviewed the films.  See HPI.     Assessment & Plan:    1. Nephrolithiasis -KUB stable left renal stone -asymptomatic  2. Bladder stone -KUB stable bladder stone -Asymptomatic  3.  Elevated PSA -Likely due to BPH versus prostate cancer as his PSA density is less than 0.1 -Will continue to monitor  4. BPH with LUTS -PSA stable   -DRE benign  -continue conservative management, avoiding bladder irritants and timed voiding's  Return in about 6 months (around 05/04/2023) for PSA only .  These notes generated with voice recognition software. I apologize for typographical errors.  Maxwell Jones  [facility] [address]  [address] [place], [place] [address] [phone]

## 2022-11-01 ENCOUNTER — Ambulatory Visit
Admission: RE | Admit: 2022-11-01 | Discharge: 2022-11-01 | Disposition: A | Discharge status: Home / Self Care | Payer: PPO | Attending: Urology | Admitting: Urology

## 2022-11-01 ENCOUNTER — Encounter: Payer: Self-pay | Admitting: Urology

## 2022-11-01 ENCOUNTER — Ambulatory Visit
Admission: RE | Admit: 2022-11-01 | Discharge: 2022-11-01 | Disposition: A | Discharge status: Home / Self Care | Payer: PPO | Source: Ambulatory Visit | Attending: Urology | Admitting: Urology

## 2022-11-01 ENCOUNTER — Ambulatory Visit: Payer: PPO | Admitting: Urology

## 2022-11-01 VITALS — BP 119/64 | HR 66 | Wt 184.9 lb

## 2022-11-01 DIAGNOSIS — N138 Other obstructive and reflux uropathy: Secondary | ICD-10-CM

## 2022-11-01 DIAGNOSIS — N401 Enlarged prostate with lower urinary tract symptoms: Secondary | ICD-10-CM

## 2022-11-01 DIAGNOSIS — N200 Calculus of kidney: Secondary | ICD-10-CM | POA: Diagnosis not present

## 2022-11-01 DIAGNOSIS — N210 Calculus in bladder: Secondary | ICD-10-CM | POA: Diagnosis not present

## 2022-11-01 DIAGNOSIS — R972 Elevated prostate specific antigen [PSA]: Secondary | ICD-10-CM | POA: Diagnosis not present

## 2022-11-01 DIAGNOSIS — I878 Other specified disorders of veins: Secondary | ICD-10-CM | POA: Diagnosis not present

## 2022-11-26 DIAGNOSIS — H34832 Tributary (branch) retinal vein occlusion, left eye, with macular edema: Secondary | ICD-10-CM | POA: Diagnosis not present

## 2022-12-30 DIAGNOSIS — H34832 Tributary (branch) retinal vein occlusion, left eye, with macular edema: Secondary | ICD-10-CM | POA: Diagnosis not present

## 2023-02-10 DIAGNOSIS — H34832 Tributary (branch) retinal vein occlusion, left eye, with macular edema: Secondary | ICD-10-CM | POA: Diagnosis not present

## 2023-02-12 ENCOUNTER — Ambulatory Visit
Admission: RE | Admit: 2023-02-12 | Discharge: 2023-02-12 | Disposition: A | Discharge status: Home / Self Care | Payer: PPO | Source: Ambulatory Visit | Attending: Urology | Admitting: Urology

## 2023-02-12 ENCOUNTER — Other Ambulatory Visit: Payer: Self-pay | Admitting: Urology

## 2023-02-12 DIAGNOSIS — N200 Calculus of kidney: Secondary | ICD-10-CM | POA: Insufficient documentation

## 2023-02-12 DIAGNOSIS — N210 Calculus in bladder: Secondary | ICD-10-CM

## 2023-02-12 DIAGNOSIS — R109 Unspecified abdominal pain: Secondary | ICD-10-CM | POA: Diagnosis not present

## 2023-02-12 NOTE — Progress Notes (Signed)
02/14/2023 11:09 AM   Maxwell Jones 1946-01-10 [id number]  Referring provider: Gracelyn Nurse, MD [address] [facility] [facility] [facility] [place],  [place] [address]  Urological history: 1.  Elevated PSA -PSA (09/2022) 6.2  -prostate biopsy (2016) - negative -PSA (2020) 43.9   2. Nephrolithiasis -left URS 2015 -CT renal stone 04/2021 - 6 mm distal right ureteral stone, 6 mm stone left renal stone -KUB (10/2021) - 6 mm left stone    3. Bladder stone -cystolitholapaxy 2020 -CT renal stone 04/2021 - 0.9 cm bladder stone   4. BPH with LU TS -prostate volume on CT 2023 - ~ 99 cc -cysto 2020 - Enlarged prostate trilobar coaptation -s/p UroLift, 4 implants - 2020  Chief Complaint  Patient presents with   Nephrolithiasis   Urinary Frequency   HPI: Maxwell Jones is a 77 y.o. male who presents today for urinary frequency.   Previous records reviewed.   On Monday he received injections into his eye for his macular degeneration, he felt awful that day with chills and fever.  The next day he developed dysuria.  The dysuria has been constant, but the chills and fever have subsided.  Patient denies any modifying or aggravating factors.  Patient denies any recent UTI's, gross hematuria or suprapubic/flank pain.  Patient denies any fevers, chills, nausea or vomiting.    UA nitrate positive, leukocytes, RBCs, many bacteria  PVR 60 mL   KUB almost 2 cm bladder stone and a left renal stone  I PSS 35/6   IPSS     Row Name 02/14/23 1000         International Prostate Symptom Score   How often have you had the sensation of not emptying your bladder? Almost always     How often have you had to urinate less than every two hours? Almost always     How often have you found you stopped and started again several times when you urinated? Almost always     How often have you found it difficult to postpone urination? Almost always     How often have you had a weak urinary stream? Almost always      How often have you had to strain to start urination? Almost always     How many times did you typically get up at night to urinate? 5 Times     Total IPSS Score 35       Quality of Life due to urinary symptoms   If you were to spend the rest of your life with your urinary condition just the way it is now how would you feel about that? Terrible             Score:  1-7 Mild 8-19 Moderate 20-35 Severe   PMH: Past Medical History:  Diagnosis Date   Bladder calculi    BPH (benign prostatic hyperplasia)    History of kidney stones    Hypogonadism in male    Kidney stone on left side    Prostatitis    Urinary hesitancy     Surgical History: Past Surgical History:  Procedure Laterality Date   Cystolithalopaxy w/Holmium laser     CYSTOSCOPY WITH INSERTION OF UROLIFT N/A 01/11/2019   Procedure: CYSTOSCOPY WITH INSERTION OF UROLIFT;  Surgeon: Vanna Scotland, MD;  Location: [facility] ORS;  Service: Urology;  Laterality: N/A;   CYSTOSCOPY WITH LITHOLAPAXY N/A 01/11/2019   Procedure: CYSTOSCOPY WITH LITHOLAPAXY;  Surgeon: Vanna Scotland, MD;  Location: [facility]  ORS;  Service: Urology;  Laterality: N/A;   EXTRACORPOREAL SHOCK WAVE LITHOTRIPSY     WISDOM TOOTH EXTRACTION      Home Medications:  Allergies as of 02/14/2023       Reactions   Iodinated Contrast Media Rash   Minocycline Rash        Medication List        Accurate as of February 14, 2023 11:09 AM. If you have any questions, ask your nurse or doctor.          acetaminophen 325 MG tablet Commonly known as: TYLENOL Take 650 mg by mouth every 6 (six) hours as needed (for pain.).   cefUROXime 500 MG tablet Commonly known as: CEFTIN Take 1 tablet (500 mg total) by mouth 2 (two) times daily with a meal.   ibuprofen 200 MG tablet Commonly known as: ADVIL Take 200 mg by mouth every 6 (six) hours as needed.        Allergies:  Allergies  Allergen Reactions   Iodinated Contrast Media Rash   Minocycline  Rash    Family History: Family History  Problem Relation Age of Onset   Nephrolithiasis Mother    Kidney cancer Brother    Liver cancer Brother    Squamous cell carcinoma Brother    Prostate cancer Neg Hx    Bladder Cancer Neg Hx     Social History:  reports that he has never smoked. He has never used smokeless tobacco. He reports current alcohol use. He reports that he does not use drugs.  ROS: Pertinent ROS in HPI  Physical Exam: BP (!) 143/87   Pulse 88   Ht 5\' 8"  (1.727 m)   Wt 189 lb 7 oz (85.9 kg)   BMI 28.80 kg/m   Constitutional:  Well nourished. Alert and oriented, No acute distress. HEENT: [place] AT, moist mucus membranes.  Trachea midline Cardiovascular: No clubbing, cyanosis, or edema. Respiratory: Normal respiratory effort, no increased work of breathing. Neurologic: Grossly intact, no focal deficits, moving all 4 extremities. Psychiatric: Normal mood and affect.   Laboratory Data: Urinalysis: See HPI and EPIC  I have reviewed the labs.   Pertinent Imaging: KUB ~ 2 cm bladder stone, radiologist interpret patient still pending I have independently reviewed the films.  See HPI.    Assessment & Plan:    1. Frequency -UA suspicious for infection -Urine culture pending -Started on Ceftin 500 mg twice daily for 7 days, will adjust if necessary once urine culture and sensitivities are available -PVR demonstrates adequate emptying -KUB bladder stone stable  2. Nephrolithiasis -KUB stable left renal stone -asymptomatic  3. Bladder stone -KUB stable bladder stone -Asymptomatic -I did discuss that if he continues to have issues with UTIs or difficulty urinating, he may want to consider addressing the bladder stone and we will discuss at further at his appointment in February  4.  Elevated PSA -Likely due to BPH versus prostate cancer as his PSA density is less than 0.1 -Will continue to monitor  5. BPH with LUTS -PSA stable  -DRE benign  -continue  conservative management, avoiding bladder irritants and timed voiding's  Return for keep appointment in February .  These notes generated with voice recognition software. I apologize for typographical errors.  Cloretta Ned  [facility] [address]  [address] [place], [place] [address] [phone]

## 2023-02-14 ENCOUNTER — Ambulatory Visit: Payer: PPO | Admitting: Urology

## 2023-02-14 ENCOUNTER — Encounter: Payer: Self-pay | Admitting: Urology

## 2023-02-14 VITALS — BP 143/87 | HR 88 | Ht 68.0 in | Wt 189.4 lb

## 2023-02-14 DIAGNOSIS — R972 Elevated prostate specific antigen [PSA]: Secondary | ICD-10-CM

## 2023-02-14 DIAGNOSIS — R300 Dysuria: Secondary | ICD-10-CM | POA: Diagnosis not present

## 2023-02-14 DIAGNOSIS — N200 Calculus of kidney: Secondary | ICD-10-CM

## 2023-02-14 DIAGNOSIS — N210 Calculus in bladder: Secondary | ICD-10-CM | POA: Diagnosis not present

## 2023-02-14 DIAGNOSIS — N401 Enlarged prostate with lower urinary tract symptoms: Secondary | ICD-10-CM

## 2023-02-14 DIAGNOSIS — R350 Frequency of micturition: Secondary | ICD-10-CM

## 2023-02-14 DIAGNOSIS — N138 Other obstructive and reflux uropathy: Secondary | ICD-10-CM

## 2023-02-14 LAB — MICROSCOPIC EXAMINATION: WBC, UA: >30 /[HPF] — AB (ref 0–5)

## 2023-02-14 LAB — URINALYSIS, COMPLETE
Bilirubin, UA: NEGATIVE
Glucose, UA: NEGATIVE
Nitrite, UA: POSITIVE — AB
Specific Gravity, UA: 1.02 (ref 1.005–1.030)
Urobilinogen, Ur: 1 mg/dL (ref 0.2–1.0)
pH, UA: 6.5 (ref 5.0–7.5)

## 2023-02-14 LAB — BLADDER SCAN AMB NON-IMAGING: Scan Result: 60

## 2023-02-14 MED ORDER — CEFUROXIME AXETIL 500 MG PO TABS: 500.0000 mg | ORAL_TABLET | Freq: Two times a day (BID) | ORAL | 0 refills | Status: DC

## 2023-02-14 NOTE — Addendum Note (Signed)
Addended by: Sueanne Margarita on: 02/14/2023 11:24 AM   Modules accepted: Orders

## 2023-02-17 LAB — CULTURE, URINE COMPREHENSIVE

## 2023-02-19 DIAGNOSIS — H35352 Cystoid macular degeneration, left eye: Secondary | ICD-10-CM | POA: Diagnosis not present

## 2023-02-19 DIAGNOSIS — H34832 Tributary (branch) retinal vein occlusion, left eye, with macular edema: Secondary | ICD-10-CM | POA: Diagnosis not present

## 2023-02-19 DIAGNOSIS — H2513 Age-related nuclear cataract, bilateral: Secondary | ICD-10-CM | POA: Diagnosis not present

## 2023-03-03 ENCOUNTER — Telehealth: Payer: Self-pay

## 2023-03-03 NOTE — Telephone Encounter (Signed)
Patient called stating that same symptoms started again on 02/28/23, he was seen for the same thing on 02/14/23. He finished his antibiotic and symptoms resolved for about a week and now again pain and burning with urination, frequency. No hematuria, no abdominal or flank pain. He has been taking Advil so he he is not sure if he has had fever or not. He would like to know how to proceed if he has to come back in again or just a UA re check.  States no kidney stone pain.

## 2023-03-03 NOTE — Telephone Encounter (Signed)
Patient can come tomorrow morning or afternoon, what times are available?

## 2023-03-03 NOTE — Telephone Encounter (Signed)
Patient advised and scheduled for tomorrow at 1:15 pm

## 2023-03-03 NOTE — Progress Notes (Unsigned)
03/04/2023 2:47 PM   Maxwell Jones 10-27-1945 [id number]  Referring provider: Gracelyn Nurse, MD [address] [facility] [address] [facility] [place],  [place] [address]  Urological history: 1.  Elevated PSA -PSA (09/2022) 6.2  -prostate biopsy (2016) - negative -PSA (2020) 43.9   2. Nephrolithiasis -left URS 2015 -CT renal stone 04/2021 - 6 mm distal right ureteral stone, 6 mm stone left renal stone -KUB (10/2021) - 6 mm left stone    3. Bladder stone -cystolitholapaxy 2020 -CT renal stone 04/2021 - 0.9 cm bladder stone   4. BPH with LU TS -prostate volume on CT 2023 - ~ 99 cc -cysto 2020 - Enlarged prostate trilobar coaptation -s/p UroLift, 4 implants - 2020  No chief complaint on file.  HPI: Maxwell Jones is a 77 y.o. male who presents today for return of UTI symptoms.  Previous records reviewed.   At his visit on 02/14/2023, on Monday he received injections into his eye for his macular degeneration, he felt awful that day with chills and fever.  The next day he developed dysuria.  The dysuria has been constant, but the chills and fever have subsided.  Patient denies any modifying or aggravating factors.  Patient denies any recent UTI's, gross hematuria or suprapubic/flank pain.  Patient denies any fevers, chills, nausea or vomiting.  UA nitrate positive, leukocytes, RBCs, many bacteria.  Urine culture was positive for Klebsiella oxytoca.   He was given 7 days of culture appropriate antibiotics.  PVR 60 mL.  KUB almost 2 cm bladder stone and a left renal stone.    He called the office yesterday stating that for 1 week he had no symptoms after he finished the antibiotic, but the dysuria and frequency have returned.  UA ***  PMH: Past Medical History:  Diagnosis Date   Bladder calculi    BPH (benign prostatic hyperplasia)    History of kidney stones    Hypogonadism in male    Kidney stone on left side    Prostatitis    Urinary hesitancy     Surgical History: Past  Surgical History:  Procedure Laterality Date   Cystolithalopaxy w/Holmium laser     CYSTOSCOPY WITH INSERTION OF UROLIFT N/A 01/11/2019   Procedure: CYSTOSCOPY WITH INSERTION OF UROLIFT;  Surgeon: Vanna Scotland, MD;  Location: [facility] ORS;  Service: Urology;  Laterality: N/A;   CYSTOSCOPY WITH LITHOLAPAXY N/A 01/11/2019   Procedure: CYSTOSCOPY WITH LITHOLAPAXY;  Surgeon: Vanna Scotland, MD;  Location: [facility] ORS;  Service: Urology;  Laterality: N/A;   EXTRACORPOREAL SHOCK WAVE LITHOTRIPSY     WISDOM TOOTH EXTRACTION      Home Medications:  Allergies as of 03/04/2023       Reactions   Iodinated Contrast Media Rash   Minocycline Rash        Medication List        Accurate as of March 03, 2023  2:47 PM. If you have any questions, ask your nurse or doctor.          acetaminophen 325 MG tablet Commonly known as: TYLENOL Take 650 mg by mouth every 6 (six) hours as needed (for pain.).   cefUROXime 500 MG tablet Commonly known as: CEFTIN Take 1 tablet (500 mg total) by mouth 2 (two) times daily with a meal.   ibuprofen 200 MG tablet Commonly known as: ADVIL Take 200 mg by mouth every 6 (six) hours as needed.        Allergies:  Allergies  Allergen Reactions  Iodinated Contrast Media Rash   Minocycline Rash    Family History: Family History  Problem Relation Age of Onset   Nephrolithiasis Mother    Kidney cancer Brother    Liver cancer Brother    Squamous cell carcinoma Brother    Prostate cancer Neg Hx    Bladder Cancer Neg Hx     Social History:  reports that he has never smoked. He has never used smokeless tobacco. He reports current alcohol use. He reports that he does not use drugs.  ROS: Pertinent ROS in HPI  Physical Exam: There were no vitals taken for this visit.  Constitutional:  Well nourished. Alert and oriented, No acute distress. HEENT: [place] AT, moist mucus membranes.  Trachea midline, no masses. Cardiovascular: No clubbing, cyanosis, or  edema. Respiratory: Normal respiratory effort, no increased work of breathing. GI: Abdomen is soft, non tender, non distended, no abdominal masses. Liver and spleen not palpable.  No hernias appreciated.  Stool sample for occult testing is not indicated.   GU: No CVA tenderness.  No bladder fullness or masses.  Patient with circumcised/uncircumcised phallus. ***Foreskin easily retracted***  Urethral meatus is patent.  No penile discharge. No penile lesions or rashes. Scrotum without lesions, cysts, rashes and/or edema.  Testicles are located scrotally bilaterally. No masses are appreciated in the testicles. Left and right epididymis are normal. Rectal: Patient with  normal sphincter tone. Anus and perineum without scarring or rashes. No rectal masses are appreciated. Prostate is approximately *** grams, *** nodules are appreciated. Seminal vesicles are normal. Skin: No rashes, bruises or suspicious lesions. Lymph: No cervical or inguinal adenopathy. Neurologic: Grossly intact, no focal deficits, moving all 4 extremities. Psychiatric: Normal mood and affect.    Laboratory Data: Urinalysis: See HPI and EPIC  I have reviewed the labs.   Pertinent Imaging: Narrative & Impression  CLINICAL DATA:  Flank pain, nephrolithiasis.   EXAM: ABDOMEN - 1 VIEW   COMPARISON:  11/01/2022.  CT 04/03/2021   FINDINGS: Left midpole renal stone again noted, unchanged measuring 5 mm. Multiple calcifications in the pelvis are similar to prior study, many of which likely reflect phleboliths. One large calcification in the right pelvis measuring up to 1.8 cm may reflect the previously seen bladder stone on CT. No calcifications project over the right kidney. Nonobstructive bowel gas pattern. No organomegaly or free air.   IMPRESSION: Left nephrolithiasis.   Multiple calcifications in the pelvis, most of which reflect previously seen phleboliths. 1.8 cm right pelvic calcification may reflect the  previously seen bladder stone.     Electronically Signed   By: Charlett Nose M.D.   On: 03/02/2023 08:41  I have independently reviewed the films.  See HPI.    Assessment & Plan:    1. Frequency -UA suspicious for infection -Urine culture pending -Started on Ceftin 500 mg twice daily for 7 days, will adjust if necessary once urine culture and sensitivities are available -PVR demonstrates adequate emptying -KUB bladder stone stable  2. Nephrolithiasis -KUB stable left renal stone -asymptomatic  3. Bladder stone -KUB stable bladder stone -Asymptomatic -I did discuss that if he continues to have issues with UTIs or difficulty urinating, he may want to consider addressing the bladder stone and we will discuss at further at his appointment in February  4.  Elevated PSA -Likely due to BPH versus prostate cancer as his PSA density is less than 0.1 -Will continue to monitor  5. BPH with LUTS -PSA stable  -DRE benign  -  continue conservative management, avoiding bladder irritants and timed voiding's  No follow-ups on file.  These notes generated with voice recognition software. I apologize for typographical errors.  Cloretta Ned  [facility] [address]  [address] [place], [place] [address] [phone]

## 2023-03-04 ENCOUNTER — Ambulatory Visit: Payer: PPO | Admitting: Urology

## 2023-03-04 ENCOUNTER — Encounter: Payer: Self-pay | Admitting: Urology

## 2023-03-04 VITALS — BP 159/72 | HR 94 | Ht 68.0 in | Wt 187.0 lb

## 2023-03-04 DIAGNOSIS — R300 Dysuria: Secondary | ICD-10-CM | POA: Diagnosis not present

## 2023-03-04 DIAGNOSIS — N210 Calculus in bladder: Secondary | ICD-10-CM | POA: Diagnosis not present

## 2023-03-04 DIAGNOSIS — N200 Calculus of kidney: Secondary | ICD-10-CM

## 2023-03-04 DIAGNOSIS — R972 Elevated prostate specific antigen [PSA]: Secondary | ICD-10-CM | POA: Diagnosis not present

## 2023-03-04 DIAGNOSIS — N138 Other obstructive and reflux uropathy: Secondary | ICD-10-CM

## 2023-03-04 DIAGNOSIS — R350 Frequency of micturition: Secondary | ICD-10-CM

## 2023-03-04 DIAGNOSIS — N401 Enlarged prostate with lower urinary tract symptoms: Secondary | ICD-10-CM

## 2023-03-04 LAB — MICROSCOPIC EXAMINATION: WBC, UA: >30 /HPF — AB (ref 0–5)

## 2023-03-04 LAB — URINALYSIS, COMPLETE
Bilirubin, UA: NEGATIVE
Glucose, UA: NEGATIVE
Ketones, UA: NEGATIVE
Nitrite, UA: POSITIVE — AB
Specific Gravity, UA: >1.03 — ABNORMAL HIGH (ref 1.005–1.030)
Urobilinogen, Ur: 0.2 mg/dL (ref 0.2–1.0)
pH, UA: 5.5 (ref 5.0–7.5)

## 2023-03-04 MED ORDER — CEFUROXIME AXETIL 500 MG PO TABS: 500.0000 mg | ORAL_TABLET | Freq: Two times a day (BID) | ORAL | 0 refills | Status: DC

## 2023-03-05 ENCOUNTER — Ambulatory Visit
Admission: RE | Admit: 2023-03-05 | Discharge: 2023-03-05 | Disposition: A | Discharge status: Home / Self Care | Payer: PPO | Source: Ambulatory Visit | Attending: Urology | Admitting: Urology

## 2023-03-05 DIAGNOSIS — N400 Enlarged prostate without lower urinary tract symptoms: Secondary | ICD-10-CM | POA: Diagnosis not present

## 2023-03-05 DIAGNOSIS — N200 Calculus of kidney: Secondary | ICD-10-CM | POA: Insufficient documentation

## 2023-03-05 DIAGNOSIS — K5730 Diverticulosis of large intestine without perforation or abscess without bleeding: Secondary | ICD-10-CM | POA: Diagnosis not present

## 2023-03-05 DIAGNOSIS — N210 Calculus in bladder: Secondary | ICD-10-CM | POA: Insufficient documentation

## 2023-03-06 ENCOUNTER — Telehealth: Payer: Self-pay | Admitting: Urology

## 2023-03-07 ENCOUNTER — Other Ambulatory Visit: Payer: Self-pay | Admitting: Urology

## 2023-03-07 DIAGNOSIS — R300 Dysuria: Secondary | ICD-10-CM

## 2023-03-07 LAB — CULTURE, URINE COMPREHENSIVE

## 2023-03-07 MED ORDER — CEFUROXIME AXETIL 500 MG PO TABS: 500.0000 mg | ORAL_TABLET | Freq: Two times a day (BID) | ORAL | 0 refills | Status: DC

## 2023-03-24 DIAGNOSIS — H34832 Tributary (branch) retinal vein occlusion, left eye, with macular edema: Secondary | ICD-10-CM | POA: Diagnosis not present

## 2023-04-15 ENCOUNTER — Encounter: Payer: Self-pay | Admitting: Urology

## 2023-04-15 ENCOUNTER — Ambulatory Visit: Payer: PPO | Admitting: Urology

## 2023-04-15 VITALS — BP 160/87 | HR 80 | Ht 68.0 in | Wt 190.2 lb

## 2023-04-15 DIAGNOSIS — N400 Enlarged prostate without lower urinary tract symptoms: Secondary | ICD-10-CM | POA: Diagnosis not present

## 2023-04-15 DIAGNOSIS — R350 Frequency of micturition: Secondary | ICD-10-CM

## 2023-04-15 DIAGNOSIS — N210 Calculus in bladder: Secondary | ICD-10-CM | POA: Diagnosis not present

## 2023-04-15 LAB — URINALYSIS, COMPLETE
Bilirubin, UA: NEGATIVE
Glucose, UA: NEGATIVE
Ketones, UA: NEGATIVE
Nitrite, UA: NEGATIVE
Protein,UA: NEGATIVE
RBC, UA: NEGATIVE
Specific Gravity, UA: 1.025 (ref 1.005–1.030)
Urobilinogen, Ur: 0.2 mg/dL (ref 0.2–1.0)
pH, UA: 6 (ref 5.0–7.5)

## 2023-04-15 LAB — MICROSCOPIC EXAMINATION

## 2023-04-15 NOTE — Progress Notes (Signed)
   04/15/23  CC:  Chief Complaint  Patient presents with   Cysto    HPI: 78 year old male who presents today for cystoscopy for further evaluation of BPH as well as bladder stones.  Notably, he is doing fairly well until few months ago when he developed a urinary tract infection and irritative urinary symptoms.  He underwent a CT scan that shows a left lower pole stone as well as multiple stones in his bladder.  Blood pressure (!) 160/87, pulse 80, height 5' 8 (1.727 m), weight 190 lb 4 oz (86.3 kg). NED. A&Ox3.   No respiratory distress   Abd soft, NT, ND Normal phallus with bilateral descended testicles  Cystoscopy Procedure Note  Patient identification was confirmed, informed consent was obtained, and patient was prepped using Betadine solution.  Lidocaine  jelly was administered per urethral meatus.     Pre-Procedure: - Inspection reveals a normal caliber ureteral meatus.  Procedure: The flexible cystoscope was introduced without difficulty - No urethral strictures/lesions are present. - Enlarged prostate  - Normal bladder neck - Bilateral ureteral orifices identified - Bladder mucosa  reveals no ulcers, tumors, or lesions - No bladder stones - Mild trabeculation  Retroflexion shows 3 distinct bladder stones of various sizes.  When using the beak of the scope to try to move these, they do appear to be adherent.  There is a submucosal UroLift clip adjacent to this but is not in continuity with the collecting system.  Suspicious for calcification of possibly eroded clip with overlying calcification.  Post-Procedure: - Patient tolerated the procedure well  Assessment/ Plan:  Bladder stones/ BPH-CT scan reviewed, stones are on the right side today as they are cystoscopically and seem to be adhered to the mucosa.  Suspicious for eroded UroLift clip with overlying calcification.  We discussed management of this along with BPH at follow-up visit.    F/u discuss  surgery  Rosina Riis, MD

## 2023-04-23 ENCOUNTER — Ambulatory Visit: Payer: PPO | Admitting: Urology

## 2023-04-23 DIAGNOSIS — R350 Frequency of micturition: Secondary | ICD-10-CM | POA: Diagnosis not present

## 2023-04-23 DIAGNOSIS — Z01818 Encounter for other preprocedural examination: Secondary | ICD-10-CM

## 2023-04-23 DIAGNOSIS — N210 Calculus in bladder: Secondary | ICD-10-CM

## 2023-04-23 DIAGNOSIS — N401 Enlarged prostate with lower urinary tract symptoms: Secondary | ICD-10-CM | POA: Diagnosis not present

## 2023-04-23 LAB — URINALYSIS, COMPLETE
Bilirubin, UA: NEGATIVE
Glucose, UA: NEGATIVE
Ketones, UA: NEGATIVE
Nitrite, UA: NEGATIVE
Protein,UA: NEGATIVE
Specific Gravity, UA: 1.025 (ref 1.005–1.030)
Urobilinogen, Ur: 0.2 mg/dL (ref 0.2–1.0)
pH, UA: 6 (ref 5.0–7.5)

## 2023-04-23 LAB — MICROSCOPIC EXAMINATION: WBC, UA: >30 /[HPF] — AB (ref 0–5)

## 2023-04-23 NOTE — Patient Instructions (Signed)
Holmium Laser Enucleation of the Prostate (HoLEP)  HoLEP is a treatment for men with benign prostatic hyperplasia (BPH). The laser surgery removed blockages of urine flow, and is done without any incisions on the body.     What is HoLEP?  HoLEP is a type of laser surgery used to treat obstruction (blockage) of urine flow as a result of benign prostatic hyperplasia (BPH). In men with BPH, the prostate gland is not cancerous, but has become enlarged. An enlarged prostate can result in a number of urinary tract symptoms such as weak urinary stream, difficulty in starting urination, inability to urinate, frequent urination, or getting up at night to urinate.  HoLEP was developed in the 1990's as a more effective and less expensive surgical option for BPH, compared to other surgical options such as laser vaporization(PVP/greenlight laser), transurethral resection of the prostate(TURP), and open simple prostatectomy.   What happens during a HoLEP?  HoLEP requires general anesthesia ("asleep" throughout the procedure).   An antibiotic is given to reduce the risk of infection  A surgical instrument called a resectoscope is inserted through the urethra (the tube that carries urine from the bladder). The resectoscope has a camera that allows the surgeon to view the internal structure of the prostate gland, and to see where the incisions are being made during surgery.  The laser is inserted into the resectoscope and is used to enucleate (free up) the enlarged prostate tissue from the capsule (outer shell) and then to seal up any blood vessels. The tissue that has been removed is pushed back into the bladder.  A morcellator is placed through the resectoscope, and is used to suction out the prostate tissue that has been pushed into the bladder.  When the prostate tissue has been removed, the resectoscope is removed, and a foley catheter is placed to allow healing and drain the urine from the  bladder.     What happens after a HoLEP?  More than 95% of patients go home the same day a few hours after surgery. Less than 5% will be admitted to the hospital overnight for observation to monitor the urine, or if they have other medical problems.  Fluid is flushed through the catheter for about 1 hour after surgery to clear any blood from the urine. It is normal to have some blood in the urine after surgery. The need for blood transfusion is extremely rare.  Eating and drinking are permitted after the procedure once the patient has fully awakened from anesthesia.  The catheter is usually removed 2-3 days after surgery- the patient will come to clinic to have the catheter removed and make sure they can urinate on their own.  It is very important to drink lots of fluids after surgery for one week to keep the bladder flushed.  At first, there may be some burning with urination, but this typically improved within a few hours to days. Most patients do not have a significant amount of pain, and narcotic pain medications are rarely needed.  Symptoms of urinary frequency, urgency, and even leakage are NORMAL for the first few weeks after surgery as the bladder adjusts after having to work hard against blockage from the prostate for many years. This will improve, but can sometimes take several months.  The use of pelvic floor exercises (Kegel exercises) can help improve problems with urinary incontinence.   After catheter removal, patients will be seen at 12 weeks and 6 months for symptom check  No heavy lifting for  at least 2-3 weeks after surgery, however patients can walk and do light activities the first day after surgery. Return to work time depends on occupation.    What are the advantages of HoLEP?  HoLEP has been studied in many different parts of the world and has been shown to be a safe and effective procedure. Although there are many types of BPH surgeries available, HoLEP offers a  unique advantage in being able to remove a large amount of tissue without any incisions on the body, even in very large prostates, while decreasing the risk of bleeding and providing tissue for pathology (to look for cancer). This decreases the need for blood transfusions during surgery, minimizes hospital stay, and reduces the risk of needing repeat treatment.  What are the side effects of HoLEP?  Temporary burning and bleeding during urination. Some blood may be seen in the urine for weeks after surgery and is part of the healing process.  Urinary incontinence (inability to control urine flow) is expected in all patients immediately after surgery and they should wear pads for the first few days/weeks. This typically improves over the course of several weeks. Performing Kegel exercises can help decrease leakage from stress maneuvers such as coughing, sneezing, or lifting. The rate of long term leakage is very low, 1-2%. Patients may also have leakage with urgency and this may be treated with medication. The risk of urge incontinence can be dependent on several factors including age, prostate size, symptoms, and other medical problems.  Retrograde ejaculation or "backwards ejaculation." In 80% of cases, the patient will not see any fluid during ejaculation after surgery.  Erectile function is generally not significantly affected.   What are the risks of HoLEP?  Injury to the urethra or development of scar tissue at a later date  Injury to the capsule of the prostate (typically treated with longer catheterization).  Injury to the bladder or ureteral orifices (where the urine from the kidney drains out)  Infection of the bladder, testes, or kidneys (~4%)  Return of urinary obstruction at a later date requiring another operation (<2%)  Need for blood transfusion or re-operation due to bleeding  Failure to relieve all symptoms and/or need for prolonged catheterization after surgery  5-15% of  patients are found to have previously undiagnosed prostate cancer in their specimen. Prostate cancer can be treated after HoLEP.  Standard risks of anesthesia including blood clots, heart attacks, etc ~1-2% risk of long term urinary incontinence (leakage)  When should I call my doctor?  Fever over 101.3 degrees  Inability to urinate, or large blood clots in the urine    Transurethral Resection of the Prostate Transurethral resection of the prostate (TURP) is the removal, or resection, of part of the prostate tissue. This procedure is done to treat an enlarged prostate gland (benign prostatic hyperplasia). The goal of TURP is to remove enough prostate tissue to allow for a normal flow of urine. The procedure will allow you to empty your bladder more completely when you urinate so that you can urinate less often. In a transurethral resection, a thin telescope with a light, a camera, and an electric cutting edge (resectoscope) is passed through the urethra and into the prostate. The opening of the urethra is at the end of the penis. Tell a health care provider about: Any allergies you have. All medicines you are taking, including vitamins, herbs, eye drops, creams, and over-the-counter medicines. Any problems you or family members have had with anesthetic medicines. Any  bleeding problems you have. Any surgeries you have had. Any medical conditions you have. Any prostate infections you have had. What are the risks? Generally, this is a safe procedure. However, problems may occur, including: Infection. Bleeding. Allergic reactions to medicines. Blood in the urine (hematuria). Damage to nearby structures or organs. Other problems may occur, but they are rare. They include: Dry ejaculation, or having no semen come out during orgasm. Erectile dysfunction, or being unable to have or keep an erection. Scarring that leads to narrowing of the urethra. This narrowing may block the flow of  urine. Inability to control when you urinate (incontinence). Deep vein thrombosis. This is a blood clot that can develop in your leg. TURP syndrome. This can happen when you lose too much sodium during or after the procedure. Some signs and symptoms of this condition include: Weakness. Headaches. Nausea or vomiting. Muscle cramping. What happens before the procedure? When to stop eating and drinking Follow instructions from your health care provider about what you may eat and drink before your procedure. These may include: 8 hours before your procedure Stop eating most foods. Do not eat meat, fried foods, or fatty foods. Eat only light foods, such as toast or crackers. All liquids are okay except energy drinks and alcohol. 6 hours before your procedure Stop eating. Drink only clear liquids, such as water, clear fruit juice, black coffee, plain tea, and sports drinks. Do not drink energy drinks or alcohol. 2 hours before your procedure Stop drinking all liquids. You may be allowed to take medicines with small sips of water. If you do not follow your health care provider's instructions, your procedure may be delayed or canceled. Medicines Ask your health care provider about: Changing or stopping your regular medicines. This is especially important if you are taking diabetes medicines or blood thinners. Taking medicines such as aspirin and ibuprofen. These medicines can thin your blood. Do not take these medicines unless your health care provider tells you to take them. Taking over-the-counter medicines, vitamins, herbs, and supplements. Surgery safety Ask your health care provider what steps will be taken to help prevent infection. These steps may include: Removing hair at the surgery site. Washing skin with a germ-killing soap. Taking antibiotic medicine. General instructions Do not use any products that contain nicotine or tobacco for at least 4 weeks before the procedure. These  products include cigarettes, chewing tobacco, and vaping devices, such as e-cigarettes. If you need help quitting, ask your health care provider. If you will be going home right after the procedure, plan to have a responsible adult: Take you home from the hospital or clinic. You will not be allowed to drive. Care for you for the time you are told. What happens during the procedure?  An IV will be inserted into one of your veins. You will be given one or more of the following: A medicine to help you relax (sedative). A medicine to make you fall asleep (general anesthetic). A medicine that is injected into your spine to numb the area below and slightly above the injection site (spinal anesthetic). Your legs will be placed in foot rests (stirrups) so that your legs are apart and your knees are bent. The resectoscope will be passed through your urethra to your prostate. Parts of your prostate will be resected using the cutting edge of the resectoscope. Fluid will be passed to rinse out the cut tissues (irrigation). The resectoscope will be removed. A small, thin tube (catheter) will be passed through  your urethra and into your bladder. The catheter will drain urine into a bag outside of your body. The procedure may vary among health care providers and hospitals. What happens after the procedure? Your blood pressure, heart rate, breathing rate, and blood oxygen level will be monitored until you leave the hospital or clinic. You will be given fluids through the IV. The IV will be removed when you start eating and drinking normally. You may have some pain. Pain medicine will be available to help you. You will have a catheter draining your urine. You may have blood in your urine. Your catheter may be kept in until your urine is clear. Your urinary drainage will be monitored. If necessary, your bladder may be rinsed out (irrigated) through your catheter. You will be encouraged to walk around as soon  as possible. You may have to wear compression stockings. These stockings help to prevent blood clots and reduce swelling in your legs. If you were given a sedative during the procedure, it can affect you for several hours. Do not drive or operate machinery until your health care provider says that it is safe. Summary Transurethral resection of the prostate (TURP) is the removal (resection) of part of the prostate tissue. The goal of this procedure is to remove enough prostate tissue to allow for a normal flow of urine. Follow instructions from your health care provider about taking medicines and about eating and drinking before the procedure. This information is not intended to replace advice given to you by your health care provider. Make sure you discuss any questions you have with your health care provider.

## 2023-04-24 ENCOUNTER — Encounter: Payer: Self-pay | Admitting: Urology

## 2023-04-25 NOTE — Progress Notes (Signed)
I,Amy L Pierron,acting as a scribe for Vanna Scotland, MD.,have documented all relevant documentation on the behalf of Vanna Scotland, MD,as directed by  Vanna Scotland, MD while in the presence of Vanna Scotland, MD.  04/23/2023 5:53 PM   Maxwell Jones Jan 05, 1946 [id number]  Referring provider: Gracelyn Nurse, MD [address] [facility] [place],  [place] [address]  Chief Complaint  Patient presents with   discuss stone management    HPI: 78 year-old male presents today for further discussion of his bladder stones.  He underwent cystoscopy by me on 04/15/2023. There are 3 distinct stones and it was suspicious that they were adhering to the UroLift clip.  He reports noticing a bit more urinary urgency throughout the day recently. Also an increase in getting up a night.  PMH: Past Medical History:  Diagnosis Date   Bladder calculi    BPH (benign prostatic hyperplasia)    History of kidney stones    Hypogonadism in male    Kidney stone on left side    Prostatitis    Urinary hesitancy     Surgical History: Past Surgical History:  Procedure Laterality Date   Cystolithalopaxy w/Holmium laser     CYSTOSCOPY WITH INSERTION OF UROLIFT N/A 01/11/2019   Procedure: CYSTOSCOPY WITH INSERTION OF UROLIFT;  Surgeon: Vanna Scotland, MD;  Location: [facility] OR[facility];  Service: Urology;  Laterality: N/A;   CYSTOSCOPY WITH LITHOLAPAXY N/A 01/11/2019   Procedure: CYSTOSCOPY WITH LITHOLAPAXY;  Surgeon: Vanna Scotland, MD;  Location: [facility] OR[facility];  Service: Urology;  Laterality: N/A;   EXTRACORPOREAL SHOCK WAVE LITHOTRIPSY     WISDOM TOOTH EXTRACTION      Home Medications:  Allergies as of 04/23/2023       Reactions   Iodinated Contrast Media Rash   Minocycline Rash        Medication List        Accurate as of April 23, 2023 11:59 PM. If you have any questions, ask your nurse or doctor.          acetaminophen 325 MG tablet Commonly known as: TYLENOL Take 650 mg by mouth  every 6 (six) hours as needed (for pain.).        Allergies:  Allergies  Allergen Reactions   Iodinated Contrast Media Rash   Minocycline Rash    Family History: Family History  Problem Relation Age of Onset   Nephrolithiasis Mother    Kidney cancer Brother    Liver cancer Brother    Squamous cell carcinoma Brother    Prostate cancer Neg Hx    Bladder Cancer Neg Hx     Social History:  reports that he has never smoked. He has never used smokeless tobacco. He reports current alcohol use. He reports that he does not use drugs.   Physical Exam: BP (!) 151/84   Pulse 80   Ht 5\' 8"  (1.727 m)   Wt 190 lb 2 oz (86.2 kg)   BMI 28.91 kg/m   Constitutional:  Alert and oriented, No acute distress. HEENT:  AT, moist mucus membranes.  Trachea midline, no masses. Neurologic: Grossly intact, no focal deficits, moving all 4 extremities. Psychiatric: Normal mood and affect.   Urinalysis    Component Value Date/Time   APPEARANCEUR Hazy (A) 04/23/2023 1613   GLUCOSEU Negative 04/23/2023 1613   BILIRUBINUR Negative 04/23/2023 1613   PROTEINUR Negative 04/23/2023 1613   NITRITE Negative 04/23/2023 1613   LEUKOCYTESUR 2+ (A) 04/23/2023 1613    Lab Results  Component Value Date   LABMICR See below: 04/23/2023   WBCUA >30 (A) 04/23/2023   RBCUA 0-2 07/05/2015   LABEPIT 0-10 04/23/2023   MUCUS Present (A) 04/15/2023   BACTERIA Moderate (A) 04/23/2023     Assessment & Plan:    1. Bladder stones/ BPH/possible bladder foreign body  - Explained the bladder stones are not passable on their own. Suspicious they are forming on the clips of his Urolift. If that is the case then the clips would need to be removed which would undo the Urolift procedure. Since he had the formation of the stones and infections, likely the Urolift has stopped working correctly.  Alternatively, the stones may be result of clip erosion although not seen definitively on cystoscopy but suspicious for  this. -We discussed the option of treating his bladder stones with removal of possible foreign body if identified with or without an outlet procedure such as TURP and/or HoLEP.  Alternatively, could just treat the stones and if there is an eroded clip, we would address this at the same time.  Each procedure explained in detail, risks and benefits of each reviewed. All questions were answered. Information booklets were given on each option.  - He needs to discuss this with his children, but at this point in time he leaning towards have the TURP done along with the bladder stone removal. He will reach out via Mychart of the decision.  I have reviewed the above documentation for accuracy and completeness, and I agree with the above.   Vanna Scotland, MD  Addendum: Patient contacted the office and let us know that he only wants to have his stones addressed and possibly removal of his bladder foreign body if present.  Will arrange for this. [facility] [address], [address] [place], [place] [address] [phone]

## 2023-04-26 LAB — CULTURE, URINE COMPREHENSIVE

## 2023-04-28 ENCOUNTER — Encounter: Payer: Self-pay | Admitting: Urology

## 2023-04-29 ENCOUNTER — Telehealth: Payer: Self-pay

## 2023-04-29 ENCOUNTER — Other Ambulatory Visit: Payer: Self-pay

## 2023-04-29 DIAGNOSIS — T191XXA Foreign body in bladder, initial encounter: Secondary | ICD-10-CM

## 2023-04-29 DIAGNOSIS — N210 Calculus in bladder: Secondary | ICD-10-CM

## 2023-04-29 MED ORDER — NITROFURANTOIN MONOHYD MACRO 100 MG PO CAPS: 100.0000 mg | ORAL_CAPSULE | Freq: Two times a day (BID) | ORAL | 0 refills | Status: DC

## 2023-04-29 NOTE — Telephone Encounter (Signed)
  Per Dr. Apolinar Junes, Patient is to be scheduled for Cystolitholapaxy and Possible removal of Bladder Foreign Body   Mr. Kerschner was contacted and possible surgical dates were discussed, Monday February 10th, 2025 was agreed upon for surgery.    Patient was directed to call [phone] between 1-3pm the day before surgery to find out surgical arrival time.  Instructions were given not to eat or drink from midnight on the night before surgery and have a driver for the day of surgery. On the surgery day patient was instructed to enter through the Medical Mall entrance of [facility] report the Same Day Surgery desk.   Pre-Admit Testing will be in contact via phone to set up an interview with the anesthesia team to review your history and medications prior to surgery.   Reminder of this information was sent via MyChart to the patient.

## 2023-04-29 NOTE — Progress Notes (Signed)
Surgical Physician Order Form [facility]  Dr. Vanna Scotland, MD  * Scheduling expectation : Next Available  *Length of Case:   *Clearance needed: no  *Anticoagulation Instructions: Hold all anticoagulants  *Aspirin Instructions: Hold Aspirin  *Post-op visit Date/Instructions:  TBD  *Diagnosis: Bladder stone, possible bladder foreign body  *Procedure: Cystolitholapaxy, possible removal of bladder foreign body   Additional orders: N/A  -Admit type: OUTpatient  -Anesthesia: General  -VTE Prophylaxis Standing Order SCD's       Other:   -Standing Lab Orders Per Anesthesia    Lab other: UA&Urine Culture  -Standing Test orders EKG/Chest x-ray per Anesthesia       Test other:   - Medications:  Ancef 2gm IV  -Other orders:  N/A

## 2023-04-29 NOTE — Progress Notes (Signed)
   [facility] Surgical Posting Form  Surgery Date: Date: 05/12/2023  Surgeon: Dr. Vanna Scotland, MD  Inpt ( No  )   Outpt (Yes)   Obs ( No  )   Diagnosis: N21.0 Bladder Stone, T19.1XXA Bladder Foreign Body  -CPT: [id number], [id number]  Surgery: Cystolitholapaxy and Possible removal of Bladder Foreign Body  Stop Anticoagulations: Yes and hold ASA  Cardiac/Medical/Pulmonary Clearance needed: no  *Orders entered into EPIC  Date: 04/29/23   *Case booked in EPIC  Date: 04/29/23  *Notified pt of Surgery: Date: 04/29/23  PRE-OP UA & CX: yes, obtained in clinic on 04/23/2023  *Placed into Prior Authorization Work Angela Nevin Date: 04/29/23  Assistant/laser/rep:No

## 2023-05-02 ENCOUNTER — Encounter
Admission: RE | Admit: 2023-05-02 | Discharge: 2023-05-02 | Disposition: A | Discharge status: Home / Self Care | Payer: PPO | Source: Ambulatory Visit | Attending: Urology

## 2023-05-02 DIAGNOSIS — N210 Calculus in bladder: Secondary | ICD-10-CM

## 2023-05-02 DIAGNOSIS — Z01812 Encounter for preprocedural laboratory examination: Secondary | ICD-10-CM

## 2023-05-02 DIAGNOSIS — Z01810 Encounter for preprocedural cardiovascular examination: Secondary | ICD-10-CM

## 2023-05-02 HISTORY — DX: Calculus in bladder: N21.0

## 2023-05-02 HISTORY — DX: Elevated prostate specific antigen (PSA): R97.20

## 2023-05-02 HISTORY — DX: Gastro-esophageal reflux disease without esophagitis: K21.9

## 2023-05-02 NOTE — Patient Instructions (Signed)
Your procedure is scheduled on:05-12-23 Monday Report to the Registration Desk on the 1st floor of the Medical Mall.Then proceed to the 2nd floor Surgery Desk To find out your arrival time, please call [phone] between 1PM - 3PM on:05-09-23 Friday If your arrival time is 6:00 am, do not arrive before that time as the Medical Mall entrance doors do not open until 6:00 am.  REMEMBER: Instructions that are not followed completely may result in serious medical risk, up to and including death; or upon the discretion of your surgeon and anesthesiologist your surgery may need to be rescheduled.  Do not eat food OR drink any liquids after midnight the night before surgery.  No gum chewing or hard candies.  One week prior to surgery:Stop NOW (05-02-23) Stop Anti-inflammatories (NSAIDS) such as Advil, Aleve, Ibuprofen, Motrin, Naproxen, Naprosyn and Aspirin based products such as Excedrin, Goody's Powder, BC Powder. Stop ANY OVER THE COUNTER supplements until after surgery.  You may however, continue to take Tylenol if needed for pain up until the day of surgery.  Continue taking all of your other prescription medications up until the day of surgery.  Do NOT take any medication the day of surgery  No Alcohol for 24 hours before or after surgery.  No Smoking including e-cigarettes for 24 hours before surgery.  No chewable tobacco products for at least 6 hours before surgery.  No nicotine patches on the day of surgery.  Do not use any "recreational" drugs for at least a week (preferably 2 weeks) before your surgery.  Please be advised that the combination of cocaine and anesthesia may have negative outcomes, up to and including death. If you test positive for cocaine, your surgery will be cancelled.  On the morning of surgery brush your teeth with toothpaste and water, you may rinse your mouth with mouthwash if you wish. Do not swallow any toothpaste or mouthwash.  Do not wear jewelry,  make-up, hairpins, clips or nail polish.  For welded (permanent) jewelry: bracelets, anklets, waist bands, etc.  Please have this removed prior to surgery.  If it is not removed, there is a chance that hospital personnel will need to cut it off on the day of surgery.  Do not wear lotions, powders, or perfumes.   Do not shave body hair from the neck down 48 hours before surgery.  Contact lenses, hearing aids and dentures may not be worn into surgery.  Do not bring valuables to the hospital. [facility] is not responsible for any missing/lost belongings or valuables.   Notify your doctor if there is any change in your medical condition (cold, fever, infection).  Wear comfortable clothing (specific to your surgery type) to the hospital.  After surgery, you can help prevent lung complications by doing breathing exercises.  Take deep breaths and cough every 1-2 hours. Your doctor may order a device called an Incentive Spirometer to help you take deep breaths. When coughing or sneezing, hold a pillow firmly against your incision with both hands. This is called "splinting." Doing this helps protect your incision. It also decreases belly discomfort.  If you are being admitted to the hospital overnight, leave your suitcase in the car. After surgery it may be brought to your room.  In case of increased patient census, it may be necessary for you, the patient, to continue your postoperative care in the Same Day Surgery department.  If you are being discharged the day of surgery, you will not be allowed to drive home.  You will need a responsible individual to drive you home and stay with you for 24 hours after surgery.   If you are taking public transportation, you will need to have a responsible individual with you.  Please call the Pre-admissions Testing Dept. at [phone] if you have any questions about these instructions.  Surgery Visitation Policy:  Patients having surgery or a  procedure may have two visitors.  Children under the age of 74 must have an adult with them who is not the patient.  Temporary Visitor Restrictions Due to increasing cases of flu, RSV and COVID-19: Children ages 21 and under will not be able to visit patients in [facility] hospitals under most circumstances.

## 2023-05-05 ENCOUNTER — Encounter
Admission: RE | Admit: 2023-05-05 | Discharge: 2023-05-05 | Disposition: A | Discharge status: Home / Self Care | Payer: PPO | Source: Ambulatory Visit | Attending: Urology | Admitting: Urology

## 2023-05-05 ENCOUNTER — Other Ambulatory Visit: Payer: PPO

## 2023-05-05 DIAGNOSIS — N210 Calculus in bladder: Secondary | ICD-10-CM | POA: Insufficient documentation

## 2023-05-05 DIAGNOSIS — Z01818 Encounter for other preprocedural examination: Secondary | ICD-10-CM | POA: Diagnosis not present

## 2023-05-05 DIAGNOSIS — Z01810 Encounter for preprocedural cardiovascular examination: Secondary | ICD-10-CM

## 2023-05-05 DIAGNOSIS — Z01812 Encounter for preprocedural laboratory examination: Secondary | ICD-10-CM

## 2023-05-05 LAB — CBC
HCT: 47.9 % (ref 39.0–52.0)
Hemoglobin: 16.4 g/dL (ref 13.0–17.0)
MCH: 32.2 pg (ref 26.0–34.0)
MCHC: 34.2 g/dL (ref 30.0–36.0)
MCV: 93.9 fL (ref 80.0–100.0)
Platelets: 226 10*3/uL (ref 150–400)
RBC: 5.1 MIL/uL (ref 4.22–5.81)
RDW: 13.1 % (ref 11.5–15.5)
WBC: 4.7 10*3/uL (ref 4.0–10.5)
nRBC: 0 % (ref 0.0–0.2)

## 2023-05-05 LAB — BASIC METABOLIC PANEL
Anion gap: 9 (ref 5–15)
BUN: 27 mg/dL — ABNORMAL HIGH (ref 8–23)
CO2: 25 mmol/L (ref 22–32)
Calcium: 9.3 mg/dL (ref 8.9–10.3)
Chloride: 108 mmol/L (ref 98–111)
Creatinine, Ser: 0.9 mg/dL (ref 0.61–1.24)
GFR, Estimated: >60 mL/min (ref >60)
Glucose, Bld: 87 mg/dL (ref 70–99)
Potassium: 4.2 mmol/L (ref 3.5–5.1)
Sodium: 142 mmol/L (ref 135–145)

## 2023-05-06 ENCOUNTER — Ambulatory Visit: Payer: PPO | Admitting: Urology

## 2023-05-12 ENCOUNTER — Encounter: Payer: Self-pay | Admitting: Urology

## 2023-05-12 ENCOUNTER — Ambulatory Visit: Payer: HMO | Admitting: Urgent Care

## 2023-05-12 ENCOUNTER — Other Ambulatory Visit: Payer: Self-pay

## 2023-05-12 ENCOUNTER — Ambulatory Visit: Payer: HMO | Admitting: Certified Registered"

## 2023-05-12 ENCOUNTER — Ambulatory Visit
Admission: RE | Admit: 2023-05-12 | Discharge: 2023-05-12 | Disposition: A | Discharge status: Home / Self Care | Payer: HMO | Attending: Urology | Admitting: Urology

## 2023-05-12 ENCOUNTER — Encounter
Admission: RE | Disposition: A | Discharge status: Home / Self Care | Payer: Self-pay | Source: Home / Self Care | Attending: Urology

## 2023-05-12 DIAGNOSIS — N210 Calculus in bladder: Secondary | ICD-10-CM | POA: Insufficient documentation

## 2023-05-12 DIAGNOSIS — N400 Enlarged prostate without lower urinary tract symptoms: Secondary | ICD-10-CM | POA: Insufficient documentation

## 2023-05-12 DIAGNOSIS — N200 Calculus of kidney: Secondary | ICD-10-CM | POA: Diagnosis not present

## 2023-05-12 DIAGNOSIS — N3289 Other specified disorders of bladder: Secondary | ICD-10-CM | POA: Insufficient documentation

## 2023-05-12 DIAGNOSIS — N401 Enlarged prostate with lower urinary tract symptoms: Secondary | ICD-10-CM | POA: Diagnosis not present

## 2023-05-12 DIAGNOSIS — N138 Other obstructive and reflux uropathy: Secondary | ICD-10-CM

## 2023-05-12 DIAGNOSIS — T191XXA Foreign body in bladder, initial encounter: Secondary | ICD-10-CM

## 2023-05-12 HISTORY — PX: CYSTOSCOPY WITH LITHOLAPAXY: SHX1425

## 2023-05-12 SURGERY — CYSTOSCOPY, WITH BLADDER CALCULUS LITHOLAPAXY
Anesthesia: General

## 2023-05-12 MED ORDER — FENTANYL CITRATE (PF) 100 MCG/2ML IJ SOLN: 25.0000 ug | INTRAMUSCULAR | Status: DC | PRN

## 2023-05-12 MED ORDER — LIDOCAINE HCL (CARDIAC) PF 100 MG/5ML IV SOSY
PREFILLED_SYRINGE | INTRAVENOUS | Status: DC | PRN
  Administered 2023-05-12: 100 mg via INTRAVENOUS

## 2023-05-12 MED ORDER — CHLORHEXIDINE GLUCONATE 0.12 % MT SOLN
OROMUCOSAL | Status: AC
  Filled 2023-05-12: qty 15

## 2023-05-12 MED ORDER — SODIUM CHLORIDE 0.9 % IR SOLN
Status: DC | PRN
Start: 2023-05-12 — End: 2023-05-12
  Administered 2023-05-12: 6000 mL

## 2023-05-12 MED ORDER — ACETAMINOPHEN 10 MG/ML IV SOLN
INTRAVENOUS | Status: DC | PRN
  Administered 2023-05-12: 1000 mg via INTRAVENOUS

## 2023-05-12 MED ORDER — LACTATED RINGERS IV SOLN: INTRAVENOUS | Status: DC

## 2023-05-12 MED ORDER — CHLORHEXIDINE GLUCONATE 0.12 % MT SOLN
15.0000 mL | Freq: Once | OROMUCOSAL | Status: AC
  Administered 2023-05-12: 15 mL via OROMUCOSAL

## 2023-05-12 MED ORDER — DEXAMETHASONE SODIUM PHOSPHATE 10 MG/ML IJ SOLN
INTRAMUSCULAR | Status: DC | PRN
  Administered 2023-05-12: 10 mg via INTRAVENOUS

## 2023-05-12 MED ORDER — PROPOFOL 10 MG/ML IV BOLUS
INTRAVENOUS | Status: DC | PRN
  Administered 2023-05-12: 200 mg via INTRAVENOUS

## 2023-05-12 MED ORDER — ACETAMINOPHEN 10 MG/ML IV SOLN
INTRAVENOUS | Status: AC
  Filled 2023-05-12: qty 100

## 2023-05-12 MED ORDER — GLYCOPYRROLATE 0.2 MG/ML IJ SOLN
INTRAMUSCULAR | Status: DC | PRN
  Administered 2023-05-12: .2 mg via INTRAVENOUS

## 2023-05-12 MED ORDER — FENTANYL CITRATE (PF) 100 MCG/2ML IJ SOLN
INTRAMUSCULAR | Status: DC | PRN
  Administered 2023-05-12 (×2): 50 ug via INTRAVENOUS

## 2023-05-12 MED ORDER — SUCCINYLCHOLINE CHLORIDE 200 MG/10ML IV SOSY
PREFILLED_SYRINGE | INTRAVENOUS | Status: DC | PRN
  Administered 2023-05-12: 100 mg via INTRAVENOUS

## 2023-05-12 MED ORDER — EPHEDRINE SULFATE-NACL 50-0.9 MG/10ML-% IV SOSY
PREFILLED_SYRINGE | INTRAVENOUS | Status: DC | PRN
  Administered 2023-05-12 (×2): 160 mg via INTRAVENOUS

## 2023-05-12 MED ORDER — ORAL CARE MOUTH RINSE: 15.0000 mL | Freq: Once | OROMUCOSAL | Status: AC

## 2023-05-12 MED ORDER — CEFAZOLIN SODIUM-DEXTROSE 2-4 GM/100ML-% IV SOLN
INTRAVENOUS | Status: AC
  Filled 2023-05-12: qty 100

## 2023-05-12 MED ORDER — ONDANSETRON HCL 4 MG/2ML IJ SOLN
INTRAMUSCULAR | Status: DC | PRN
  Administered 2023-05-12 (×2): 4 mg via INTRAVENOUS

## 2023-05-12 MED ORDER — FENTANYL CITRATE (PF) 100 MCG/2ML IJ SOLN
INTRAMUSCULAR | Status: AC
  Filled 2023-05-12: qty 2

## 2023-05-12 MED ORDER — ROCURONIUM BROMIDE 100 MG/10ML IV SOLN
INTRAVENOUS | Status: DC | PRN
  Administered 2023-05-12: 50 mg via INTRAVENOUS
  Administered 2023-05-12: 10 mg via INTRAVENOUS

## 2023-05-12 MED ORDER — CEFAZOLIN SODIUM-DEXTROSE 2-4 GM/100ML-% IV SOLN
2.0000 g | INTRAVENOUS | Status: AC
  Administered 2023-05-12: 2 g via INTRAVENOUS

## 2023-05-12 SURGICAL SUPPLY — 13 items
BAG DRAIN SIEMENS DORNER NS (MISCELLANEOUS) ×1 IMPLANT
BASKET ZERO TIP 1.9FR (BASKET) ×1 IMPLANT
FIBER LASER MOSES 550 DFL (Laser) IMPLANT
GLOVE BIO SURGEON STRL SZ 6.5 (GLOVE) ×1 IMPLANT
GOWN STRL REUS W/ TWL LRG LVL3 (GOWN DISPOSABLE) ×2 IMPLANT
IV NS IRRIG 3000ML ARTHROMATIC (IV SOLUTION) ×2 IMPLANT
KIT TURNOVER CYSTO (KITS) ×1 IMPLANT
PACK CYSTO AR (MISCELLANEOUS) ×1 IMPLANT
SET IRRIG Y TYPE TUR BLADDER L (SET/KITS/TRAYS/PACK) ×1 IMPLANT
SYR TOOMEY IRRIG 70ML (MISCELLANEOUS) ×1
SYRINGE TOOMEY IRRIG 70ML (MISCELLANEOUS) ×1 IMPLANT
VALVE UROSEAL ADJ ENDO (VALVE) IMPLANT
WATER STERILE IRR 500ML POUR (IV SOLUTION) ×1 IMPLANT

## 2023-05-12 NOTE — Transfer of Care (Signed)
 Immediate Anesthesia Transfer of Care Note  Patient: Maxwell Jones  Procedure(s) Performed: CYSTOSCOPY WITH LITHOLAPAXY  Patient Location: PACU  Anesthesia Type:General  Level of Consciousness: awake, drowsy, and patient cooperative  Airway & Oxygen Therapy: Patient Spontanous Breathing and Patient connected to face mask oxygen  Post-op Assessment: Report given to RN and Post -op Vital signs reviewed and stable  Post vital signs: Reviewed and stable  Last Vitals:  Vitals Value Taken Time  BP 130/71 05/12/23 1355  Temp 36.6 C 05/12/23 1355  Pulse 84 05/12/23 1358  Resp 14 05/12/23 1358  SpO2 100 % 05/12/23 1358  Vitals shown include unfiled device data.  Last Pain:  Vitals:   05/12/23 1355  PainSc: 0-No pain         Complications: No notable events documented.

## 2023-05-12 NOTE — Interval H&P Note (Signed)
 History and Physical Interval Note:  05/12/2023 12:53 PM  [name]  has presented today for surgery, with the diagnosis of Bladder Stone, Possible Bladder Foreign Body.  The various methods of treatment have been discussed with the patient and family. After consideration of risks, benefits and other options for treatment, the patient has consented to  Procedure(s): CYSTOSCOPY WITH LITHOLAPAXY (N/A) FOREIGN BODY REMOVAL FROM BLADDER ADULT (N/A) as a surgical intervention.  [name] history has been reviewed, patient examined, no change in status, stable for surgery.  I have reviewed the patient's chart and labs.  Questions were answered to the patient's satisfaction.    RRR CTAB   Maxwell Jones

## 2023-05-12 NOTE — Op Note (Signed)
 Date of procedure: 05/12/23  Preoperative diagnosis:  BPH with bladder stones  Postoperative diagnosis:  Same above  Procedure: Cystoscopy Cystolitholapaxy  Surgeon: Dustin Gimenez, MD  Anesthesia: General  Complications: None  Intraoperative findings: 3 distinct bladder stones largest of which measures 1.6 cm.  Prostamegaly with your clips in place, reasonably open anterior channel.  Mild bladder trabeculation.  EBL: Minimal  Specimens: None  Drains: None  Indication: Maxwell Jones is a 78 y.o. 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  He has elected to defer redo outlet procedure.  After reviewing the management options for treatment, he elected to proceed with the above surgical procedure(s). We have discussed the potential benefits and risks of the procedure, side effects of the proposed treatment, the likelihood of the patient achieving the goals of the procedure, and any potential problems that might occur during the procedure or recuperation. Informed consent has been obtained.  Description of procedure:  The patient was taken to the operating room and general anesthesia was induced.  The patient was placed in the dorsal lithotomy position, prepped and draped in the usual sterile fashion, and preoperative antibiotics were administered. A preoperative time-out was performed.   A 21 French scope was advanced per urethra into the bladder.  The 3 stones in question were seen measuring up to 1.6 cm.  The remainder of the bladder was evaluated which was trabeculated but there was no obvious intravesical foreign bodies appreciated.  I then exchanged for a 70 degree lens to expect inspected the bladder neck and this was also normal mucosa without any visible clip erosion.  The scope was then removed and a 26 French resectoscope was brought in.  Using continuous normal saline for irrigant, I brought in a 500 m laser fiber and dusted the  stones in question using the settings of 1 J and 70 Hz.  He was unable to irrigate all of the stone material out of the bladder both using a Toomey syringe as well as by directed irrigation.  Finally, when last cystoscopy was performed this time with the flexible cystoscope to make sure that the entirety of the bladder neck was inspected.  This was performed by retroflexion.  Again, no foreign body was identified.  At this point in time, the scope was removed.  The patient was then cleaned and dried, repositioned the supine position, reversed from anesthesia and taken to the PACU in stable condition.  Dustin Gimenez, M.D.

## 2023-05-12 NOTE — Anesthesia Procedure Notes (Addendum)
 Procedure Name: Intubation Date/Time: 05/12/2023 1:14 PM  Performed by: Niki Barter, CRNAPre-anesthesia Checklist: Patient identified, Emergency Drugs available, Suction available and Patient being monitored Patient Re-evaluated:Patient Re-evaluated prior to induction Oxygen Delivery Method: Circle system utilized Preoxygenation: Pre-oxygenation with 100% oxygen Induction Type: IV induction and Cricoid Pressure applied Ventilation: Mask ventilation without difficulty Laryngoscope Size: McGrath and 3 Grade View: Grade II Tube type: Oral Number of attempts: 1 Airway Equipment and Method: Stylet Placement Confirmation: ETT inserted through vocal cords under direct vision, positive ETCO2 and breath sounds checked- equal and bilateral Secured at: 21 cm Tube secured with: Tape Dental Injury: Teeth and Oropharynx as per pre-operative assessment  Comments: Atraumatic TFH CRNA

## 2023-05-12 NOTE — Anesthesia Preprocedure Evaluation (Signed)
 Anesthesia Evaluation  Patient identified by MRN, date of birth, ID band Patient awake    Reviewed: Allergy & Precautions, H&P , NPO status , Patient's Chart, lab work & pertinent test results, reviewed documented beta blocker date and time   History of Anesthesia Complications Negative for: history of anesthetic complications  Airway Mallampati: III  TM Distance: >3 FB Neck ROM: full    Dental  (+) Dental Advidsory Given, Caps   Pulmonary neg pulmonary ROS   Pulmonary exam normal        Cardiovascular Exercise Tolerance: Good negative cardio ROS Normal cardiovascular exam     Neuro/Psych negative neurological ROS  negative psych ROS   GI/Hepatic Neg liver ROS,GERD  ,,  Endo/Other  negative endocrine ROS    Renal/GU Renal disease (kidney stones)  negative genitourinary   Musculoskeletal   Abdominal   Peds  Hematology negative hematology ROS (+)   Anesthesia Other Findings Past Medical History: No date: Bladder calculi No date: BPH (benign prostatic hyperplasia) No date: History of kidney stones No date: Hypogonadism in male No date: Kidney stone on left side No date: Prostatitis No date: Urinary hesitancy   Reproductive/Obstetrics negative OB ROS                             Anesthesia Physical Anesthesia Plan  ASA: 2  Anesthesia Plan: General   Post-op Pain Management:    Induction: Intravenous  PONV Risk Score and Plan: 2 and Ondansetron , Dexamethasone  and Treatment may vary due to age or medical condition  Airway Management Planned: LMA and Oral ETT  Additional Equipment:   Intra-op Plan:   Post-operative Plan: Extubation in OR  Informed Consent: I have reviewed the patients History and Physical, chart, labs and discussed the procedure including the risks, benefits and alternatives for the proposed anesthesia with the patient or authorized representative who has  indicated his/her understanding and acceptance.     Dental Advisory Given  Plan Discussed with: Anesthesiologist, CRNA and Surgeon  Anesthesia Plan Comments:         Anesthesia Quick Evaluation

## 2023-05-12 NOTE — Discharge Instructions (Signed)
 Drink plenty of fluids.  He may have blood in your urine which is normal.  If he is having significant urinary burning, consider trying over-the-counter Azo (also called Pyridium).  Dustin Gimenez, MD

## 2023-05-13 ENCOUNTER — Encounter: Payer: Self-pay | Admitting: Urology

## 2023-05-14 NOTE — Anesthesia Postprocedure Evaluation (Signed)
Anesthesia Post Note  Patient: Maxwell Jones  Procedure(s) Performed: CYSTOSCOPY WITH LITHOLAPAXY  Patient location during evaluation: PACU Anesthesia Type: General Level of consciousness: awake and alert Pain management: pain level controlled Vital Signs Assessment: post-procedure vital signs reviewed and stable Respiratory status: spontaneous breathing, nonlabored ventilation, respiratory function stable and patient connected to nasal cannula oxygen Cardiovascular status: blood pressure returned to baseline and stable Postop Assessment: no apparent nausea or vomiting Anesthetic complications: no   No notable events documented.   Last Vitals:  Vitals:   05/12/23 1430 05/12/23 1445  BP: 130/71 137/79  Pulse: 70 71  Resp: 13 14  Temp: 36.6 C 36.6 C  SpO2: 100% 100%    Last Pain:  Vitals:   05/13/23 0840  TempSrc:   PainSc: 0-No pain                 Lenard Simmer

## 2023-05-20 DIAGNOSIS — H34832 Tributary (branch) retinal vein occlusion, left eye, with macular edema: Secondary | ICD-10-CM | POA: Diagnosis not present

## 2023-06-24 DIAGNOSIS — D2271 Melanocytic nevi of right lower limb, including hip: Secondary | ICD-10-CM | POA: Diagnosis not present

## 2023-06-24 DIAGNOSIS — D2262 Melanocytic nevi of left upper limb, including shoulder: Secondary | ICD-10-CM | POA: Diagnosis not present

## 2023-06-24 DIAGNOSIS — L821 Other seborrheic keratosis: Secondary | ICD-10-CM | POA: Diagnosis not present

## 2023-06-24 DIAGNOSIS — L570 Actinic keratosis: Secondary | ICD-10-CM | POA: Diagnosis not present

## 2023-06-24 DIAGNOSIS — D2261 Melanocytic nevi of right upper limb, including shoulder: Secondary | ICD-10-CM | POA: Diagnosis not present

## 2023-06-24 DIAGNOSIS — D225 Melanocytic nevi of trunk: Secondary | ICD-10-CM | POA: Diagnosis not present

## 2023-06-24 DIAGNOSIS — D485 Neoplasm of uncertain behavior of skin: Secondary | ICD-10-CM | POA: Diagnosis not present

## 2023-06-24 DIAGNOSIS — C44311 Basal cell carcinoma of skin of nose: Secondary | ICD-10-CM | POA: Diagnosis not present

## 2023-06-24 DIAGNOSIS — D2272 Melanocytic nevi of left lower limb, including hip: Secondary | ICD-10-CM | POA: Diagnosis not present

## 2023-06-25 DIAGNOSIS — Z125 Encounter for screening for malignant neoplasm of prostate: Secondary | ICD-10-CM | POA: Diagnosis not present

## 2023-06-25 DIAGNOSIS — Z0000 Encounter for general adult medical examination without abnormal findings: Secondary | ICD-10-CM | POA: Diagnosis not present

## 2023-07-04 DIAGNOSIS — N400 Enlarged prostate without lower urinary tract symptoms: Secondary | ICD-10-CM | POA: Diagnosis not present

## 2023-07-04 DIAGNOSIS — Z0000 Encounter for general adult medical examination without abnormal findings: Secondary | ICD-10-CM | POA: Diagnosis not present

## 2023-07-04 DIAGNOSIS — Z0001 Encounter for general adult medical examination with abnormal findings: Secondary | ICD-10-CM | POA: Diagnosis not present

## 2023-07-07 ENCOUNTER — Encounter: Payer: Self-pay | Admitting: Radiation Oncology

## 2023-07-07 ENCOUNTER — Ambulatory Visit
Admission: RE | Admit: 2023-07-07 | Discharge: 2023-07-07 | Disposition: A | Discharge status: Home / Self Care | Source: Ambulatory Visit | Attending: Radiation Oncology | Admitting: Radiation Oncology

## 2023-07-07 VITALS — BP 138/87 | HR 72 | Resp 16 | Wt 189.0 lb

## 2023-07-07 DIAGNOSIS — C44311 Basal cell carcinoma of skin of nose: Secondary | ICD-10-CM | POA: Diagnosis not present

## 2023-07-07 DIAGNOSIS — Z510 Encounter for antineoplastic radiation therapy: Secondary | ICD-10-CM | POA: Insufficient documentation

## 2023-07-07 NOTE — Consult Note (Signed)
 NEW PATIENT EVALUATION  Name: Maxwell Jones  MRN: [id number]  Date:   07/07/2023     DOB: Jun 15, 1945   This 78 y.o. 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  REFERRING PHYSICIAN: Dasher, Cliffton Asters, MD  CHIEF COMPLAINT:  Chief Complaint  Patient presents with   [facility] of nose    DIAGNOSIS: The encounter diagnosis was Basal cell carcinoma of nose.   PREVIOUS INVESTIGATIONS:  Pathology reports reviewed Clinical notes reviewed  HPI: Is a 78 year old male noted by dermatology to have a lesion of the left nasal fold biopsy shave was positive for basal cell carcinoma.  Tumor was superficial nodular and infiltrative extending to the edge and base.  Patient was offered Mohs chemosurgery although was declined and is seen now for radiation oncology opinion.  He is asymptomatic.  PLANNED TREATMENT REGIMEN: Electron-beam therapy  PAST MEDICAL HISTORY:  has a past medical history of Bladder calculi, Bladder stone, BPH (benign prostatic hyperplasia), Elevated PSA, GERD (gastroesophageal reflux disease), History of kidney stones, Hypogonadism in male, Prostatitis, and Urinary hesitancy.    PAST SURGICAL HISTORY:  Past Surgical History:  Procedure Laterality Date   Cystolithalopaxy w/Holmium laser     CYSTOSCOPY WITH INSERTION OF UROLIFT N/A 01/11/2019   Procedure: CYSTOSCOPY WITH INSERTION OF UROLIFT;  Surgeon: Vanna Scotland, MD;  Location: [facility] ORS;  Service: Urology;  Laterality: N/A;   CYSTOSCOPY WITH LITHOLAPAXY N/A 01/11/2019   Procedure: CYSTOSCOPY WITH LITHOLAPAXY;  Surgeon: Vanna Scotland, MD;  Location: [facility] OR[facility];  Service: Urology;  Laterality: N/A;   CYSTOSCOPY WITH LITHOLAPAXY N/A 05/12/2023   Procedure: CYSTOSCOPY WITH LITHOLAPAXY;  Surgeon: Vanna Scotland, MD;  Location: [facility] ORS;  Service: Urology;  Laterality: N/A;   EXTRACORPOREAL SHOCK WAVE LITHOTRIPSY     WISDOM TOOTH EXTRACTION      FAMILY HISTORY: family history  includes Kidney cancer in his brother; Liver cancer in his brother; Nephrolithiasis in his mother; Squamous cell carcinoma in his brother.  SOCIAL HISTORY:  reports that he has never smoked. He has never used smokeless tobacco. He reports current alcohol use. He reports that he does not use drugs.  ALLERGIES: Iodinated contrast media and Minocycline  MEDICATIONS:  Current Outpatient Medications  Medication Sig Dispense Refill   acetaminophen (TYLENOL) 325 MG tablet Take 650 mg by mouth every 6 (six) hours as needed (for pain.).     No current facility-administered medications for this encounter.    ECOG PERFORMANCE STATUS:  0 - Asymptomatic  REVIEW OF SYSTEMS: Patient denies any weight loss, fatigue, weakness, fever, chills or night sweats. Patient denies any loss of vision, blurred vision. Patient denies any ringing  of the ears or hearing loss. No irregular heartbeat. Patient denies heart murmur or history of fainting. Patient denies any chest pain or pain radiating to her upper extremities. Patient denies any shortness of breath, difficulty breathing at night, cough or hemoptysis. Patient denies any swelling in the lower legs. Patient denies any nausea vomiting, vomiting of blood, or coffee ground material in the vomitus. Patient denies any stomach pain. Patient states has had normal bowel movements no significant constipation or diarrhea. Patient denies any dysuria, hematuria or significant nocturia. Patient denies any problems walking, swelling in the joints or loss of balance. Patient denies any skin changes, loss of hair or loss of weight. Patient denies any excessive worrying or anxiety or significant depression. Patient denies any problems with insomnia. Patient denies excessive thirst, polyuria, polydipsia. Patient  denies any swollen glands, patient denies easy bruising or easy bleeding. Patient denies any recent infections, allergies or URI. Patient "s visual fields have not changed  significantly in recent time.   PHYSICAL EXAM: BP 138/87   Pulse 72   Resp 16   Wt 189 lb (85.7 kg)   BMI 28.74 kg/m  Small flattened lesion of the left nasal fold.  No evidence of adenopathy in the head and neck region is noted.  No other skin lesions on the face are appreciated.  Well-developed well-nourished patient in NAD. HEENT reveals PERLA, EOMI, discs not visualized.  Oral cavity is clear. No oral mucosal lesions are identified. Neck is clear without evidence of cervical or supraclavicular adenopathy. Lungs are clear to A&P. Cardiac examination is essentially unremarkable with regular rate and rhythm without murmur rub or thrill. Abdomen is benign with no organomegaly or masses noted. Motor sensory and DTR levels are equal and symmetric in the upper and lower extremities. Cranial nerves II through XII are grossly intact. Proprioception is intact. No peripheral adenopathy or edema is identified. No motor or sensory levels are noted. Crude visual fields are within normal range.  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  Will plan on delivering 50 Gray in 25 fractions using electron beam therapy.  Risks and benefits of treatment including skin reaction possible slight fatigue all reviewed in detail with the patient with his daughter present.  Patient comprehensive recommendations well.  I personally set up and ordered CT simulation for early next week.  I would like to take this opportunity to thank you for allowing me to participate in the care of your patient.Carmina Miller, MD

## 2023-07-09 DIAGNOSIS — C44311 Basal cell carcinoma of skin of nose: Secondary | ICD-10-CM | POA: Diagnosis not present

## 2023-07-15 ENCOUNTER — Ambulatory Visit
Admission: RE | Admit: 2023-07-15 | Discharge: 2023-07-15 | Disposition: A | Discharge status: Home / Self Care | Source: Ambulatory Visit | Attending: Radiation Oncology | Admitting: Radiation Oncology

## 2023-07-15 DIAGNOSIS — C44311 Basal cell carcinoma of skin of nose: Secondary | ICD-10-CM | POA: Diagnosis not present

## 2023-07-15 DIAGNOSIS — Z510 Encounter for antineoplastic radiation therapy: Secondary | ICD-10-CM | POA: Diagnosis not present

## 2023-07-22 ENCOUNTER — Ambulatory Visit
Admission: RE | Admit: 2023-07-22 | Discharge: 2023-07-22 | Disposition: A | Discharge status: Home / Self Care | Source: Ambulatory Visit | Attending: Radiation Oncology | Admitting: Radiation Oncology

## 2023-07-22 ENCOUNTER — Other Ambulatory Visit: Payer: Self-pay

## 2023-07-22 DIAGNOSIS — C44311 Basal cell carcinoma of skin of nose: Secondary | ICD-10-CM | POA: Diagnosis not present

## 2023-07-22 DIAGNOSIS — Z510 Encounter for antineoplastic radiation therapy: Secondary | ICD-10-CM | POA: Diagnosis not present

## 2023-07-22 LAB — RAD ONC ARIA SESSION SUMMARY
Course Elapsed Days: 0
Plan Fractions Treated to Date: 1
Plan Prescribed Dose Per Fraction: 2 Gy
Plan Total Fractions Prescribed: 25
Plan Total Prescribed Dose: 50 Gy
Reference Point Dosage Given to Date: 2 Gy
Reference Point Session Dosage Given: 2 Gy
Session Number: 1

## 2023-07-23 ENCOUNTER — Other Ambulatory Visit: Payer: Self-pay

## 2023-07-23 ENCOUNTER — Ambulatory Visit
Admission: RE | Admit: 2023-07-23 | Discharge: 2023-07-23 | Disposition: A | Discharge status: Home / Self Care | Source: Ambulatory Visit | Attending: Radiation Oncology | Admitting: Radiation Oncology

## 2023-07-23 DIAGNOSIS — Z510 Encounter for antineoplastic radiation therapy: Secondary | ICD-10-CM | POA: Diagnosis not present

## 2023-07-23 LAB — RAD ONC ARIA SESSION SUMMARY
Course Elapsed Days: 1
Plan Fractions Treated to Date: 1
Plan Prescribed Dose Per Fraction: 2 Gy
Plan Total Fractions Prescribed: 24
Plan Total Prescribed Dose: 48 Gy
Reference Point Dosage Given to Date: 4 Gy
Reference Point Session Dosage Given: 2 Gy
Session Number: 2

## 2023-07-24 ENCOUNTER — Ambulatory Visit
Admission: RE | Admit: 2023-07-24 | Discharge: 2023-07-24 | Disposition: A | Discharge status: Home / Self Care | Source: Ambulatory Visit | Attending: Radiation Oncology | Admitting: Radiation Oncology

## 2023-07-24 ENCOUNTER — Other Ambulatory Visit: Payer: Self-pay

## 2023-07-24 DIAGNOSIS — Z510 Encounter for antineoplastic radiation therapy: Secondary | ICD-10-CM | POA: Diagnosis not present

## 2023-07-24 LAB — RAD ONC ARIA SESSION SUMMARY
Course Elapsed Days: 2
Plan Fractions Treated to Date: 2
Plan Prescribed Dose Per Fraction: 2 Gy
Plan Total Fractions Prescribed: 24
Plan Total Prescribed Dose: 48 Gy
Reference Point Dosage Given to Date: 6 Gy
Reference Point Session Dosage Given: 2 Gy
Session Number: 3

## 2023-07-25 ENCOUNTER — Ambulatory Visit
Admission: RE | Admit: 2023-07-25 | Discharge: 2023-07-25 | Disposition: A | Discharge status: Home / Self Care | Source: Ambulatory Visit | Attending: Radiation Oncology | Admitting: Radiation Oncology

## 2023-07-25 ENCOUNTER — Other Ambulatory Visit: Payer: Self-pay

## 2023-07-25 DIAGNOSIS — Z510 Encounter for antineoplastic radiation therapy: Secondary | ICD-10-CM | POA: Diagnosis not present

## 2023-07-25 LAB — RAD ONC ARIA SESSION SUMMARY
Course Elapsed Days: 3
Plan Fractions Treated to Date: 3
Plan Prescribed Dose Per Fraction: 2 Gy
Plan Total Fractions Prescribed: 24
Plan Total Prescribed Dose: 48 Gy
Reference Point Dosage Given to Date: 8 Gy
Reference Point Session Dosage Given: 2 Gy
Session Number: 4

## 2023-07-28 ENCOUNTER — Other Ambulatory Visit: Payer: Self-pay

## 2023-07-28 ENCOUNTER — Ambulatory Visit
Admission: RE | Admit: 2023-07-28 | Discharge: 2023-07-28 | Disposition: A | Discharge status: Home / Self Care | Source: Ambulatory Visit | Attending: Radiation Oncology | Admitting: Radiation Oncology

## 2023-07-28 DIAGNOSIS — C44311 Basal cell carcinoma of skin of nose: Secondary | ICD-10-CM | POA: Diagnosis not present

## 2023-07-28 DIAGNOSIS — Z510 Encounter for antineoplastic radiation therapy: Secondary | ICD-10-CM | POA: Diagnosis not present

## 2023-07-28 LAB — RAD ONC ARIA SESSION SUMMARY
Course Elapsed Days: 6
Plan Fractions Treated to Date: 4
Plan Prescribed Dose Per Fraction: 2 Gy
Plan Total Fractions Prescribed: 24
Plan Total Prescribed Dose: 48 Gy
Reference Point Dosage Given to Date: 10 Gy
Reference Point Session Dosage Given: 2 Gy
Session Number: 5

## 2023-07-29 ENCOUNTER — Ambulatory Visit
Admission: RE | Admit: 2023-07-29 | Discharge: 2023-07-29 | Disposition: A | Discharge status: Home / Self Care | Source: Ambulatory Visit | Attending: Radiation Oncology | Admitting: Radiation Oncology

## 2023-07-29 ENCOUNTER — Other Ambulatory Visit: Payer: Self-pay

## 2023-07-29 DIAGNOSIS — H34832 Tributary (branch) retinal vein occlusion, left eye, with macular edema: Secondary | ICD-10-CM | POA: Diagnosis not present

## 2023-07-29 DIAGNOSIS — Z510 Encounter for antineoplastic radiation therapy: Secondary | ICD-10-CM | POA: Diagnosis not present

## 2023-07-29 LAB — RAD ONC ARIA SESSION SUMMARY
Course Elapsed Days: 7
Plan Fractions Treated to Date: 5
Plan Prescribed Dose Per Fraction: 2 Gy
Plan Total Fractions Prescribed: 24
Plan Total Prescribed Dose: 48 Gy
Reference Point Dosage Given to Date: 12 Gy
Reference Point Session Dosage Given: 2 Gy
Session Number: 6

## 2023-07-30 ENCOUNTER — Ambulatory Visit
Admission: RE | Admit: 2023-07-30 | Discharge: 2023-07-30 | Disposition: A | Discharge status: Home / Self Care | Source: Ambulatory Visit | Attending: Radiation Oncology | Admitting: Radiation Oncology

## 2023-07-30 ENCOUNTER — Other Ambulatory Visit: Payer: Self-pay

## 2023-07-30 DIAGNOSIS — Z510 Encounter for antineoplastic radiation therapy: Secondary | ICD-10-CM | POA: Diagnosis not present

## 2023-07-30 LAB — RAD ONC ARIA SESSION SUMMARY
Course Elapsed Days: 8
Plan Fractions Treated to Date: 6
Plan Prescribed Dose Per Fraction: 2 Gy
Plan Total Fractions Prescribed: 24
Plan Total Prescribed Dose: 48 Gy
Reference Point Dosage Given to Date: 14 Gy
Reference Point Session Dosage Given: 2 Gy
Session Number: 7

## 2023-07-31 ENCOUNTER — Ambulatory Visit
Admission: RE | Admit: 2023-07-31 | Discharge: 2023-07-31 | Disposition: A | Discharge status: Home / Self Care | Source: Ambulatory Visit | Attending: Radiation Oncology | Admitting: Radiation Oncology

## 2023-07-31 ENCOUNTER — Other Ambulatory Visit: Payer: Self-pay

## 2023-07-31 DIAGNOSIS — Z510 Encounter for antineoplastic radiation therapy: Secondary | ICD-10-CM | POA: Diagnosis not present

## 2023-07-31 DIAGNOSIS — C44311 Basal cell carcinoma of skin of nose: Secondary | ICD-10-CM | POA: Diagnosis not present

## 2023-07-31 LAB — RAD ONC ARIA SESSION SUMMARY
Course Elapsed Days: 9
Plan Fractions Treated to Date: 7
Plan Prescribed Dose Per Fraction: 2 Gy
Plan Total Fractions Prescribed: 24
Plan Total Prescribed Dose: 48 Gy
Reference Point Dosage Given to Date: 16 Gy
Reference Point Session Dosage Given: 2 Gy
Session Number: 8

## 2023-08-01 ENCOUNTER — Ambulatory Visit
Admission: RE | Admit: 2023-08-01 | Discharge: 2023-08-01 | Disposition: A | Discharge status: Home / Self Care | Source: Ambulatory Visit | Attending: Radiation Oncology | Admitting: Radiation Oncology

## 2023-08-01 ENCOUNTER — Other Ambulatory Visit: Payer: Self-pay

## 2023-08-01 DIAGNOSIS — Z510 Encounter for antineoplastic radiation therapy: Secondary | ICD-10-CM | POA: Diagnosis not present

## 2023-08-01 LAB — RAD ONC ARIA SESSION SUMMARY
Course Elapsed Days: 10
Plan Fractions Treated to Date: 8
Plan Prescribed Dose Per Fraction: 2 Gy
Plan Total Fractions Prescribed: 24
Plan Total Prescribed Dose: 48 Gy
Reference Point Dosage Given to Date: 18 Gy
Reference Point Session Dosage Given: 2 Gy
Session Number: 9

## 2023-08-04 ENCOUNTER — Other Ambulatory Visit: Payer: Self-pay

## 2023-08-04 ENCOUNTER — Ambulatory Visit
Admission: RE | Admit: 2023-08-04 | Discharge: 2023-08-04 | Disposition: A | Discharge status: Home / Self Care | Source: Ambulatory Visit | Attending: Radiation Oncology | Admitting: Radiation Oncology

## 2023-08-04 DIAGNOSIS — C44311 Basal cell carcinoma of skin of nose: Secondary | ICD-10-CM | POA: Diagnosis not present

## 2023-08-04 DIAGNOSIS — Z510 Encounter for antineoplastic radiation therapy: Secondary | ICD-10-CM | POA: Diagnosis not present

## 2023-08-04 LAB — RAD ONC ARIA SESSION SUMMARY
Course Elapsed Days: 13
Plan Fractions Treated to Date: 9
Plan Prescribed Dose Per Fraction: 2 Gy
Plan Total Fractions Prescribed: 24
Plan Total Prescribed Dose: 48 Gy
Reference Point Dosage Given to Date: 20 Gy
Reference Point Session Dosage Given: 2 Gy
Session Number: 10

## 2023-08-05 ENCOUNTER — Ambulatory Visit
Admission: RE | Admit: 2023-08-05 | Discharge: 2023-08-05 | Disposition: A | Discharge status: Home / Self Care | Source: Ambulatory Visit | Attending: Radiation Oncology | Admitting: Radiation Oncology

## 2023-08-05 ENCOUNTER — Other Ambulatory Visit: Payer: Self-pay

## 2023-08-05 DIAGNOSIS — Z510 Encounter for antineoplastic radiation therapy: Secondary | ICD-10-CM | POA: Diagnosis not present

## 2023-08-05 LAB — RAD ONC ARIA SESSION SUMMARY
Course Elapsed Days: 14
Plan Fractions Treated to Date: 10
Plan Prescribed Dose Per Fraction: 2 Gy
Plan Total Fractions Prescribed: 24
Plan Total Prescribed Dose: 48 Gy
Reference Point Dosage Given to Date: 22 Gy
Reference Point Session Dosage Given: 2 Gy
Session Number: 11

## 2023-08-06 ENCOUNTER — Other Ambulatory Visit: Payer: Self-pay

## 2023-08-06 ENCOUNTER — Ambulatory Visit
Admission: RE | Admit: 2023-08-06 | Discharge: 2023-08-06 | Disposition: A | Discharge status: Home / Self Care | Source: Ambulatory Visit | Attending: Radiation Oncology | Admitting: Radiation Oncology

## 2023-08-06 DIAGNOSIS — Z510 Encounter for antineoplastic radiation therapy: Secondary | ICD-10-CM | POA: Diagnosis not present

## 2023-08-06 LAB — RAD ONC ARIA SESSION SUMMARY
Course Elapsed Days: 15
Plan Fractions Treated to Date: 11
Plan Prescribed Dose Per Fraction: 2 Gy
Plan Total Fractions Prescribed: 24
Plan Total Prescribed Dose: 48 Gy
Reference Point Dosage Given to Date: 24 Gy
Reference Point Session Dosage Given: 2 Gy
Session Number: 12

## 2023-08-07 ENCOUNTER — Ambulatory Visit
Admission: RE | Admit: 2023-08-07 | Discharge: 2023-08-07 | Disposition: A | Discharge status: Home / Self Care | Source: Ambulatory Visit | Attending: Radiation Oncology | Admitting: Radiation Oncology

## 2023-08-07 ENCOUNTER — Other Ambulatory Visit: Payer: Self-pay

## 2023-08-07 DIAGNOSIS — Z510 Encounter for antineoplastic radiation therapy: Secondary | ICD-10-CM | POA: Diagnosis not present

## 2023-08-07 LAB — RAD ONC ARIA SESSION SUMMARY
Course Elapsed Days: 16
Plan Fractions Treated to Date: 12
Plan Prescribed Dose Per Fraction: 2 Gy
Plan Total Fractions Prescribed: 24
Plan Total Prescribed Dose: 48 Gy
Reference Point Dosage Given to Date: 26 Gy
Reference Point Session Dosage Given: 2 Gy
Session Number: 13

## 2023-08-08 ENCOUNTER — Ambulatory Visit
Admission: RE | Admit: 2023-08-08 | Discharge: 2023-08-08 | Disposition: A | Discharge status: Home / Self Care | Source: Ambulatory Visit | Attending: Radiation Oncology | Admitting: Radiation Oncology

## 2023-08-08 ENCOUNTER — Other Ambulatory Visit: Payer: Self-pay

## 2023-08-08 DIAGNOSIS — Z510 Encounter for antineoplastic radiation therapy: Secondary | ICD-10-CM | POA: Diagnosis not present

## 2023-08-08 LAB — RAD ONC ARIA SESSION SUMMARY
Course Elapsed Days: 17
Plan Fractions Treated to Date: 13
Plan Prescribed Dose Per Fraction: 2 Gy
Plan Total Fractions Prescribed: 24
Plan Total Prescribed Dose: 48 Gy
Reference Point Dosage Given to Date: 28 Gy
Reference Point Session Dosage Given: 2 Gy
Session Number: 14

## 2023-08-11 ENCOUNTER — Other Ambulatory Visit: Payer: Self-pay

## 2023-08-11 ENCOUNTER — Ambulatory Visit
Admission: RE | Admit: 2023-08-11 | Discharge: 2023-08-11 | Disposition: A | Discharge status: Home / Self Care | Source: Ambulatory Visit | Attending: Radiation Oncology | Admitting: Radiation Oncology

## 2023-08-11 DIAGNOSIS — Z510 Encounter for antineoplastic radiation therapy: Secondary | ICD-10-CM | POA: Diagnosis not present

## 2023-08-11 DIAGNOSIS — C44311 Basal cell carcinoma of skin of nose: Secondary | ICD-10-CM | POA: Diagnosis not present

## 2023-08-11 LAB — RAD ONC ARIA SESSION SUMMARY
Course Elapsed Days: 20
Plan Fractions Treated to Date: 14
Plan Prescribed Dose Per Fraction: 2 Gy
Plan Total Fractions Prescribed: 24
Plan Total Prescribed Dose: 48 Gy
Reference Point Dosage Given to Date: 30 Gy
Reference Point Session Dosage Given: 2 Gy
Session Number: 15

## 2023-08-12 ENCOUNTER — Other Ambulatory Visit: Payer: Self-pay

## 2023-08-12 ENCOUNTER — Ambulatory Visit
Admission: RE | Admit: 2023-08-12 | Discharge: 2023-08-12 | Disposition: A | Discharge status: Home / Self Care | Source: Ambulatory Visit | Attending: Radiation Oncology | Admitting: Radiation Oncology

## 2023-08-12 DIAGNOSIS — Z510 Encounter for antineoplastic radiation therapy: Secondary | ICD-10-CM | POA: Diagnosis not present

## 2023-08-12 LAB — RAD ONC ARIA SESSION SUMMARY
Course Elapsed Days: 21
Plan Fractions Treated to Date: 15
Plan Prescribed Dose Per Fraction: 2 Gy
Plan Total Fractions Prescribed: 24
Plan Total Prescribed Dose: 48 Gy
Reference Point Dosage Given to Date: 32 Gy
Reference Point Session Dosage Given: 2 Gy
Session Number: 16

## 2023-08-13 ENCOUNTER — Other Ambulatory Visit: Payer: Self-pay

## 2023-08-13 ENCOUNTER — Ambulatory Visit
Admission: RE | Admit: 2023-08-13 | Discharge: 2023-08-13 | Disposition: A | Discharge status: Home / Self Care | Source: Ambulatory Visit | Attending: Radiation Oncology | Admitting: Radiation Oncology

## 2023-08-13 DIAGNOSIS — Z510 Encounter for antineoplastic radiation therapy: Secondary | ICD-10-CM | POA: Diagnosis not present

## 2023-08-13 LAB — RAD ONC ARIA SESSION SUMMARY
Course Elapsed Days: 22
Plan Fractions Treated to Date: 16
Plan Prescribed Dose Per Fraction: 2 Gy
Plan Total Fractions Prescribed: 24
Plan Total Prescribed Dose: 48 Gy
Reference Point Dosage Given to Date: 34 Gy
Reference Point Session Dosage Given: 2 Gy
Session Number: 17

## 2023-08-14 ENCOUNTER — Other Ambulatory Visit: Payer: Self-pay

## 2023-08-14 ENCOUNTER — Ambulatory Visit
Admission: RE | Admit: 2023-08-14 | Discharge: 2023-08-14 | Disposition: A | Discharge status: Home / Self Care | Source: Ambulatory Visit | Attending: Radiation Oncology | Admitting: Radiation Oncology

## 2023-08-14 DIAGNOSIS — Z510 Encounter for antineoplastic radiation therapy: Secondary | ICD-10-CM | POA: Diagnosis not present

## 2023-08-14 LAB — RAD ONC ARIA SESSION SUMMARY
Course Elapsed Days: 23
Plan Fractions Treated to Date: 17
Plan Prescribed Dose Per Fraction: 2 Gy
Plan Total Fractions Prescribed: 24
Plan Total Prescribed Dose: 48 Gy
Reference Point Dosage Given to Date: 36 Gy
Reference Point Session Dosage Given: 2 Gy
Session Number: 18

## 2023-08-15 ENCOUNTER — Other Ambulatory Visit: Payer: Self-pay

## 2023-08-15 ENCOUNTER — Ambulatory Visit
Admission: RE | Admit: 2023-08-15 | Discharge: 2023-08-15 | Disposition: A | Discharge status: Home / Self Care | Source: Ambulatory Visit | Attending: Radiation Oncology | Admitting: Radiation Oncology

## 2023-08-15 DIAGNOSIS — Z510 Encounter for antineoplastic radiation therapy: Secondary | ICD-10-CM | POA: Diagnosis not present

## 2023-08-15 LAB — RAD ONC ARIA SESSION SUMMARY
Course Elapsed Days: 24
Plan Fractions Treated to Date: 18
Plan Prescribed Dose Per Fraction: 2 Gy
Plan Total Fractions Prescribed: 24
Plan Total Prescribed Dose: 48 Gy
Reference Point Dosage Given to Date: 38 Gy
Reference Point Session Dosage Given: 2 Gy
Session Number: 19

## 2023-08-18 ENCOUNTER — Other Ambulatory Visit: Payer: Self-pay

## 2023-08-18 ENCOUNTER — Ambulatory Visit
Admission: RE | Admit: 2023-08-18 | Discharge: 2023-08-18 | Disposition: A | Discharge status: Home / Self Care | Source: Ambulatory Visit | Attending: Radiation Oncology | Admitting: Radiation Oncology

## 2023-08-18 DIAGNOSIS — C44311 Basal cell carcinoma of skin of nose: Secondary | ICD-10-CM | POA: Diagnosis not present

## 2023-08-18 DIAGNOSIS — Z510 Encounter for antineoplastic radiation therapy: Secondary | ICD-10-CM | POA: Diagnosis not present

## 2023-08-18 LAB — RAD ONC ARIA SESSION SUMMARY
Course Elapsed Days: 27
Plan Fractions Treated to Date: 19
Plan Prescribed Dose Per Fraction: 2 Gy
Plan Total Fractions Prescribed: 24
Plan Total Prescribed Dose: 48 Gy
Reference Point Dosage Given to Date: 40 Gy
Reference Point Session Dosage Given: 2 Gy
Session Number: 20

## 2023-08-19 ENCOUNTER — Other Ambulatory Visit: Payer: Self-pay

## 2023-08-19 ENCOUNTER — Ambulatory Visit
Admission: RE | Admit: 2023-08-19 | Discharge: 2023-08-19 | Disposition: A | Discharge status: Home / Self Care | Source: Ambulatory Visit | Attending: Radiation Oncology | Admitting: Radiation Oncology

## 2023-08-19 DIAGNOSIS — Z510 Encounter for antineoplastic radiation therapy: Secondary | ICD-10-CM | POA: Diagnosis not present

## 2023-08-19 LAB — RAD ONC ARIA SESSION SUMMARY
Course Elapsed Days: 28
Plan Fractions Treated to Date: 20
Plan Prescribed Dose Per Fraction: 2 Gy
Plan Total Fractions Prescribed: 24
Plan Total Prescribed Dose: 48 Gy
Reference Point Dosage Given to Date: 42 Gy
Reference Point Session Dosage Given: 2 Gy
Session Number: 21

## 2023-08-20 ENCOUNTER — Other Ambulatory Visit: Payer: Self-pay

## 2023-08-20 ENCOUNTER — Ambulatory Visit
Admission: RE | Admit: 2023-08-20 | Discharge: 2023-08-20 | Disposition: A | Discharge status: Home / Self Care | Source: Ambulatory Visit | Attending: Radiation Oncology | Admitting: Radiation Oncology

## 2023-08-20 DIAGNOSIS — Z510 Encounter for antineoplastic radiation therapy: Secondary | ICD-10-CM | POA: Diagnosis not present

## 2023-08-20 LAB — RAD ONC ARIA SESSION SUMMARY
Course Elapsed Days: 29
Plan Fractions Treated to Date: 21
Plan Prescribed Dose Per Fraction: 2 Gy
Plan Total Fractions Prescribed: 24
Plan Total Prescribed Dose: 48 Gy
Reference Point Dosage Given to Date: 44 Gy
Reference Point Session Dosage Given: 2 Gy
Session Number: 22

## 2023-08-21 ENCOUNTER — Other Ambulatory Visit: Payer: Self-pay

## 2023-08-21 ENCOUNTER — Ambulatory Visit
Admission: RE | Admit: 2023-08-21 | Discharge: 2023-08-21 | Disposition: A | Discharge status: Home / Self Care | Source: Ambulatory Visit | Attending: Radiation Oncology | Admitting: Radiation Oncology

## 2023-08-21 DIAGNOSIS — Z510 Encounter for antineoplastic radiation therapy: Secondary | ICD-10-CM | POA: Diagnosis not present

## 2023-08-21 LAB — RAD ONC ARIA SESSION SUMMARY
Course Elapsed Days: 30
Plan Fractions Treated to Date: 22
Plan Prescribed Dose Per Fraction: 2 Gy
Plan Total Fractions Prescribed: 24
Plan Total Prescribed Dose: 48 Gy
Reference Point Dosage Given to Date: 46 Gy
Reference Point Session Dosage Given: 2 Gy
Session Number: 23

## 2023-08-22 ENCOUNTER — Ambulatory Visit
Admission: RE | Admit: 2023-08-22 | Discharge: 2023-08-22 | Disposition: A | Discharge status: Home / Self Care | Source: Ambulatory Visit | Attending: Radiation Oncology | Admitting: Radiation Oncology

## 2023-08-22 ENCOUNTER — Other Ambulatory Visit: Payer: Self-pay

## 2023-08-22 DIAGNOSIS — Z510 Encounter for antineoplastic radiation therapy: Secondary | ICD-10-CM | POA: Diagnosis not present

## 2023-08-22 LAB — RAD ONC ARIA SESSION SUMMARY
Course Elapsed Days: 31
Plan Fractions Treated to Date: 23
Plan Prescribed Dose Per Fraction: 2 Gy
Plan Total Fractions Prescribed: 24
Plan Total Prescribed Dose: 48 Gy
Reference Point Dosage Given to Date: 48 Gy
Reference Point Session Dosage Given: 2 Gy
Session Number: 24

## 2023-08-26 ENCOUNTER — Ambulatory Visit
Admission: RE | Admit: 2023-08-26 | Discharge: 2023-08-26 | Disposition: A | Discharge status: Home / Self Care | Source: Ambulatory Visit | Attending: Radiation Oncology | Admitting: Radiation Oncology

## 2023-08-26 ENCOUNTER — Other Ambulatory Visit: Payer: Self-pay

## 2023-08-26 DIAGNOSIS — Z510 Encounter for antineoplastic radiation therapy: Secondary | ICD-10-CM | POA: Diagnosis not present

## 2023-08-26 DIAGNOSIS — C44311 Basal cell carcinoma of skin of nose: Secondary | ICD-10-CM | POA: Diagnosis not present

## 2023-08-26 LAB — RAD ONC ARIA SESSION SUMMARY
Course Elapsed Days: 35
Plan Fractions Treated to Date: 24
Plan Prescribed Dose Per Fraction: 2 Gy
Plan Total Fractions Prescribed: 24
Plan Total Prescribed Dose: 48 Gy
Reference Point Dosage Given to Date: 50 Gy
Reference Point Session Dosage Given: 2 Gy
Session Number: 25

## 2023-08-27 NOTE — Radiation Completion Notes (Signed)
 Patient Name: [name], YIELDING MRN: [id number] Date of Birth: [birth year] Referring Physician: Elida Grounds, M.D. Date of Service: 2023-08-27 Radiation Oncologist: Glenis Langdon, M.D. [facility]                             RADIATION ONCOLOGY END OF TREATMENT NOTE     Diagnosis: C44.311 Basal cell carcinoma of skin of nose Intent: Curative     HPI: Is a 78 year old male noted by dermatology to have a lesion of the left nasal fold biopsy shave was positive for basal cell carcinoma.  Tumor was superficial nodular and infiltrative extending to the edge and base.  Patient was offered Mohs chemosurgery although was declined and is seen now for radiation oncology opinion.  He is asymptomatic.      ==========DELIVERED PLANS==========  First Treatment Date: 2023-07-22 Last Treatment Date: 2023-08-26   Plan Name: HN_L_Nose Site: Nose Technique: Electron Mode: Electron Dose Per Fraction: 2 Gy Prescribed Dose (Delivered / Prescribed): 2 Gy / 2 Gy Prescribed Fxs (Delivered / Prescribed): 1 / 1   Plan Name: HN_L_Nose:1 Site: Nose Technique: Electron Mode: Electron Dose Per Fraction: 2 Gy Prescribed Dose (Delivered / Prescribed): 48 Gy / 48 Gy Prescribed Fxs (Delivered / Prescribed): 24 / 24     ==========ON TREATMENT VISIT DATES========== 2023-07-22, 2023-07-22, 2023-07-29, 2023-08-05, 2023-08-12, 2023-08-19, 2023-08-26     ==========UPCOMING VISITS========== 10/01/2023 [facility] RAD ONCOLOGY FOLLOW UP 30 Chrystal, Glenn, MD        ==========APPENDIX - ON TREATMENT VISIT NOTES==========   See weekly On Treatment Notes in Epic for details in the Media tab (listed as Progress notes on the On Treatment Visit Dates listed above).

## 2023-10-01 ENCOUNTER — Ambulatory Visit
Admission: RE | Admit: 2023-10-01 | Discharge: 2023-10-01 | Disposition: A | Discharge status: Home / Self Care | Source: Ambulatory Visit | Attending: Radiation Oncology | Admitting: Radiation Oncology

## 2023-10-01 VITALS — BP 134/82 | HR 70 | Resp 18 | Ht 68.0 in | Wt 191.0 lb

## 2023-10-01 DIAGNOSIS — C44311 Basal cell carcinoma of skin of nose: Secondary | ICD-10-CM | POA: Diagnosis not present

## 2023-10-01 NOTE — Progress Notes (Signed)
 Radiation Oncology Follow up Note  Name: Maxwell Jones   Date:   10/01/2023 MRN:  [id number] DOB: [birth year]    This 78 y.o. 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  Seen today in follow-up he is asymptomatic and doing well he has had a complete response no evidence of nodularity of his nasal region or other facial area is noted..  COMPLICATIONS OF TREATMENT: none  FOLLOW UP COMPLIANCE: keeps appointments   PHYSICAL EXAM:  BP 134/82 (BP Location: Left Arm, Patient Position: Sitting)   Pulse 70   Resp 18   Ht 5' 8 (1.727 m)   Wt 191 lb (86.6 kg)   BMI 29.04 kg/m  No evidence of mass or nodularity in the nose or facial region is noted.  No adenopathy in the submental or head and neck region is identified.  Well-developed well-nourished patient in NAD. HEENT reveals PERLA, EOMI, discs not visualized.  RADIOLOGY RESULTS: No current films for review  PLAN: Present time patient is done well excellent response to electron-beam therapy.  I will see him back 1 more time in 6 months and then turn follow-up care over to his dermatologist.  He will call contact Dr. Charlyn office for continuation of follow-up.  Patient knows to call with any concerns.  I would like to take this opportunity to thank you for allowing me to participate in the care of your patient.SABRA Marcey Penton, MD

## 2023-10-16 NOTE — Telephone Encounter (Signed)
 Error

## 2023-10-21 DIAGNOSIS — H34832 Tributary (branch) retinal vein occlusion, left eye, with macular edema: Secondary | ICD-10-CM | POA: Diagnosis not present

## 2023-11-26 DIAGNOSIS — L608 Other nail disorders: Secondary | ICD-10-CM | POA: Diagnosis not present

## 2023-11-26 DIAGNOSIS — A499 Bacterial infection, unspecified: Secondary | ICD-10-CM | POA: Diagnosis not present

## 2023-11-26 DIAGNOSIS — L603 Nail dystrophy: Secondary | ICD-10-CM | POA: Diagnosis not present

## 2023-12-17 DIAGNOSIS — L603 Nail dystrophy: Secondary | ICD-10-CM | POA: Diagnosis not present

## 2023-12-17 DIAGNOSIS — M79674 Pain in right toe(s): Secondary | ICD-10-CM | POA: Diagnosis not present

## 2023-12-30 DIAGNOSIS — D225 Melanocytic nevi of trunk: Secondary | ICD-10-CM | POA: Diagnosis not present

## 2023-12-30 DIAGNOSIS — D2261 Melanocytic nevi of right upper limb, including shoulder: Secondary | ICD-10-CM | POA: Diagnosis not present

## 2023-12-30 DIAGNOSIS — D2262 Melanocytic nevi of left upper limb, including shoulder: Secondary | ICD-10-CM | POA: Diagnosis not present

## 2023-12-30 DIAGNOSIS — L570 Actinic keratosis: Secondary | ICD-10-CM | POA: Diagnosis not present

## 2023-12-30 DIAGNOSIS — L408 Other psoriasis: Secondary | ICD-10-CM | POA: Diagnosis not present

## 2023-12-30 DIAGNOSIS — D2272 Melanocytic nevi of left lower limb, including hip: Secondary | ICD-10-CM | POA: Diagnosis not present

## 2023-12-30 DIAGNOSIS — D2271 Melanocytic nevi of right lower limb, including hip: Secondary | ICD-10-CM | POA: Diagnosis not present

## 2023-12-30 DIAGNOSIS — L821 Other seborrheic keratosis: Secondary | ICD-10-CM | POA: Diagnosis not present

## 2024-02-10 DIAGNOSIS — H2513 Age-related nuclear cataract, bilateral: Secondary | ICD-10-CM | POA: Diagnosis not present

## 2024-02-10 DIAGNOSIS — H34832 Tributary (branch) retinal vein occlusion, left eye, with macular edema: Secondary | ICD-10-CM | POA: Diagnosis not present

## 2024-02-10 DIAGNOSIS — H35352 Cystoid macular degeneration, left eye: Secondary | ICD-10-CM | POA: Diagnosis not present

## 2024-02-16 DIAGNOSIS — L603 Nail dystrophy: Secondary | ICD-10-CM | POA: Diagnosis not present

## 2024-02-16 DIAGNOSIS — M79674 Pain in right toe(s): Secondary | ICD-10-CM | POA: Diagnosis not present

## 2024-04-05 NOTE — Progress Notes (Signed)
 "    04/06/2024 9:57 AM   Maxwell Jones February 28, 1946 [id number]  Referring provider: Rudolpho Maxwell BIRCH, MD [address] [facility] [address] [facility] [place],  [place] [address]  Urological history: 1. Bladder stones  - cystolitholapaxy (2020 and 05/2023)   2.  Elevated PSA -PSA (05/2023) 11.37 -PSA (09/2022) 6.2  -prostate biopsy (2016) - negative -PSA (2020) 43.9    3. Nephrolithiasis -left URS 2015 -CT renal stone 04/2021 - 6 mm distal right ureteral stone, 6 mm stone left renal stone -KUB (10/2021) - 6 mm left stone     4. BPH with LU TS -prostate volume on CT 2023 - ~ 99 cc -cysto 2020 - Enlarged prostate trilobar coaptation -s/p UroLift, 4 implants - 2020 -cysto (04/2023) bladder stones, enlarged prostate and mild trabeculation  Chief Complaint  Patient presents with   Urinary Frequency   HPI: Maxwell Jones is a 79 y.o. man who presents today for follow up.   Previous records reviewed.  He is doing well.  He is he feels he is emptying his bladder completely and has no difficulty with urinating.  Patient denies any modifying or aggravating factors.  Patient denies any recent UTI's, gross hematuria, dysuria or suprapubic/flank pain.  Patient denies any fevers, chills, nausea or vomiting.    UA (05/2023) light yellow clear, specific gravity 1.023, pH 6.5, 1 WBC, less than 1 RBC, and 0-5 bacteria.   PVR 51 mL   PSA pending   Serum creatinine (05/2023) 1.0  PMH: Past Medical History:  Diagnosis Date   Bladder calculi    Bladder stone    BPH (benign prostatic hyperplasia)    Elevated PSA    GERD (gastroesophageal reflux disease)    History of kidney stones    Hypogonadism in male    Prostatitis    Urinary hesitancy     Surgical History: Past Surgical History:  Procedure Laterality Date   Cystolithalopaxy w/Holmium laser     CYSTOSCOPY WITH INSERTION OF UROLIFT N/A 01/11/2019   Procedure: CYSTOSCOPY WITH INSERTION OF UROLIFT;  Surgeon: Penne Knee, MD;  Location:  [facility] ORS;  Service: Urology;  Laterality: N/A;   CYSTOSCOPY WITH LITHOLAPAXY N/A 01/11/2019   Procedure: CYSTOSCOPY WITH LITHOLAPAXY;  Surgeon: Penne Knee, MD;  Location: [facility];  Service: Urology;  Laterality: N/A;   CYSTOSCOPY WITH LITHOLAPAXY N/A 05/12/2023   Procedure: CYSTOSCOPY WITH LITHOLAPAXY;  Surgeon: Penne Knee, MD;  Location: [facility] OR[facility];  Service: Urology;  Laterality: N/A;   EXTRACORPOREAL SHOCK WAVE LITHOTRIPSY     WISDOM TOOTH EXTRACTION      Home Medications:  Allergies as of 04/06/2024       Reactions   Iodinated Contrast Media Rash   Minocycline Rash        Medication List        Accurate as of April 06, 2024  9:57 AM. If you have any questions, ask your nurse or doctor.          acetaminophen  325 MG tablet Commonly known as: TYLENOL  Take 650 mg by mouth every 6 (six) hours as needed (for pain.).        Allergies: Allergies[1]  Family History: Family History  Problem Relation Age of Onset   Nephrolithiasis Mother    Kidney cancer Brother    Liver cancer Brother    Squamous cell carcinoma Brother    Prostate cancer Neg Hx    Bladder Cancer Neg Hx     Social History:  reports that he has never  smoked. He has never used smokeless tobacco. He reports current alcohol  use. He reports that he does not use drugs.  ROS: Pertinent ROS in HPI  Physical Exam: BP 134/87   Pulse 88   Wt 189 lb (85.7 kg)   SpO2 97%   BMI 28.74 kg/m   Constitutional:  Well nourished. Alert and oriented, No acute distress. HEENT: [place] AT, moist mucus membranes.  Trachea midline Cardiovascular: No clubbing, cyanosis, or edema. Respiratory: Normal respiratory effort, no increased work of breathing. GU: No CVA tenderness.  No bladder fullness or masses.  Patient with uncircumcised phallus.  Foreskin easily retracted.  Urethral meatus is patent.  No penile discharge. No penile lesions or rashes. Scrotum without lesions, cysts, rashes and/or edema.  Testicles are  located scrotally bilaterally. No masses are appreciated in the testicles. Left and right epididymis are normal. Rectal: Patient with  normal sphincter tone. Anus and perineum without scarring or rashes. No rectal masses are appreciated. Prostate is approximately 50 + grams, could not palpate the entire gland, no nodules are appreciated. Seminal vesicles are not palpated.  Neurologic: Grossly intact, no focal deficits, moving all 4 extremities. Psychiatric: Normal mood and affect.  Laboratory Data: See Epic and HPI   I have reviewed the labs.   Pertinent Imaging:  04/06/24 09:21  Scan Result 51mL    Assessment & Plan:    1. Elevated PSA - repeat PSA pending, if still elevated will consider prostate MRI  2. BPH with LU TS - UroLift (2020)  - PSA pending  - DRE BPH - PVR < 300 cc    Return in about 1 year (around 04/06/2025) for PHI, I PSS, PVR .  These notes generated with voice recognition software. I apologize for typographical errors.  CLOTILDA CORNWALL, PA-C  [facility] [address]  [address] [place], [place] [address] [phone]     [1]  Allergies Allergen Reactions   Iodinated Contrast Media Rash   Minocycline Rash   "

## 2024-04-06 ENCOUNTER — Encounter: Payer: Self-pay | Admitting: Urology

## 2024-04-06 ENCOUNTER — Ambulatory Visit: Admitting: Urology

## 2024-04-06 VITALS — BP 134/87 | HR 88 | Wt 189.0 lb

## 2024-04-06 DIAGNOSIS — R972 Elevated prostate specific antigen [PSA]: Secondary | ICD-10-CM | POA: Diagnosis not present

## 2024-04-06 DIAGNOSIS — N138 Other obstructive and reflux uropathy: Secondary | ICD-10-CM | POA: Diagnosis not present

## 2024-04-06 DIAGNOSIS — N401 Enlarged prostate with lower urinary tract symptoms: Secondary | ICD-10-CM

## 2024-04-06 LAB — BLADDER SCAN AMB NON-IMAGING

## 2024-04-08 ENCOUNTER — Ambulatory Visit: Payer: Self-pay | Admitting: Urology

## 2024-04-08 ENCOUNTER — Other Ambulatory Visit: Payer: Self-pay

## 2024-04-08 DIAGNOSIS — R972 Elevated prostate specific antigen [PSA]: Secondary | ICD-10-CM

## 2024-04-08 LAB — PHI SCORE REFLEX
% Free PSA: 14.6 %
PSA, Free: 0.83 ng/mL
Prostate Heath Index Score: 63.9
p2PSA: 22.3 pg/mL

## 2024-04-08 LAB — PROSTATE HEALTH INDEX: Prostate Specific Ag: 5.7 ng/mL — ABNORMAL HIGH (ref 0.0–3.9)

## 2024-04-16 ENCOUNTER — Ambulatory Visit
Admission: RE | Admit: 2024-04-16 | Discharge: 2024-04-16 | Disposition: A | Discharge status: Home / Self Care | Source: Ambulatory Visit | Attending: Urology | Admitting: Urology

## 2024-04-16 DIAGNOSIS — R972 Elevated prostate specific antigen [PSA]: Secondary | ICD-10-CM | POA: Insufficient documentation

## 2024-04-16 MED ORDER — GADOBUTROL 1 MMOL/ML IV SOLN
8.0000 mL | Freq: Once | INTRAVENOUS | Status: AC | PRN
  Administered 2024-04-16: 8 mL via INTRAVENOUS

## 2024-04-22 ENCOUNTER — Ambulatory Visit: Payer: Self-pay | Admitting: Urology

## 2024-05-06 NOTE — Telephone Encounter (Signed)
 Spoke with patient in regards to scheduling a fusion biopsy with Dr. Georganne. Patient was a previous patient of Dr. Penne. Patient states that he would proceed with the biopsy. Advised patient that I will check with S. McGowan PA to see if he needs an appointment with Dr. Georganne prior to having the biopsy done.   Andrea Kirks LPN

## 2024-05-06 NOTE — Telephone Encounter (Signed)
-----   Message from [facility] sent at 05/02/2024 10:07 AM EST ----- Would you call Maxwell Jones and see if he has had a chance to discuss with his family whether or not he would like to proceed with the prostate biopsy

## 2024-06-04 ENCOUNTER — Ambulatory Visit: Admitting: Urology

## 2025-03-30 ENCOUNTER — Other Ambulatory Visit

## 2025-04-06 ENCOUNTER — Ambulatory Visit: Admitting: Urology
